# Patient Record
Sex: Female | Born: 2000 | Race: Black or African American | Hispanic: No | Smoking: Never smoker
Health system: Southern US, Community
[De-identification: ages and names within clinical notes are randomized; demographics above are authoritative.]

## PROBLEM LIST (undated history)

## (undated) DIAGNOSIS — J45909 Unspecified asthma, uncomplicated: Secondary | ICD-10-CM

## (undated) DIAGNOSIS — F419 Anxiety disorder, unspecified: Secondary | ICD-10-CM

## (undated) DIAGNOSIS — D649 Anemia, unspecified: Secondary | ICD-10-CM

## (undated) DIAGNOSIS — T7840XA Allergy, unspecified, initial encounter: Secondary | ICD-10-CM

## (undated) DIAGNOSIS — I10 Essential (primary) hypertension: Secondary | ICD-10-CM

## (undated) DIAGNOSIS — E559 Vitamin D deficiency, unspecified: Secondary | ICD-10-CM

## (undated) DIAGNOSIS — F32A Depression, unspecified: Secondary | ICD-10-CM

## (undated) HISTORY — DX: Depression, unspecified: F32.A

## (undated) HISTORY — DX: Allergy, unspecified, initial encounter: T78.40XA

## (undated) HISTORY — PX: HERNIA REPAIR: SHX51

## (undated) HISTORY — DX: Essential (primary) hypertension: I10

## (undated) HISTORY — PX: WISDOM TOOTH EXTRACTION: SHX21

---
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
## 2019-01-03 ENCOUNTER — Ambulatory Visit: Payer: Medicaid Other | Admitting: Obstetrics and Gynecology

## 2019-01-20 ENCOUNTER — Encounter: Payer: Self-pay | Admitting: Emergency Medicine

## 2019-01-20 ENCOUNTER — Ambulatory Visit
Admission: EM | Admit: 2019-01-20 | Discharge: 2019-01-20 | Disposition: A | Discharge status: Home / Self Care | Payer: Medicaid Other | Attending: Internal Medicine | Admitting: Internal Medicine

## 2019-01-20 ENCOUNTER — Other Ambulatory Visit: Payer: Self-pay

## 2019-01-20 DIAGNOSIS — R05 Cough: Secondary | ICD-10-CM | POA: Diagnosis not present

## 2019-01-20 DIAGNOSIS — J45909 Unspecified asthma, uncomplicated: Secondary | ICD-10-CM | POA: Diagnosis not present

## 2019-01-20 DIAGNOSIS — Z881 Allergy status to other antibiotic agents status: Secondary | ICD-10-CM | POA: Insufficient documentation

## 2019-01-20 DIAGNOSIS — Z20828 Contact with and (suspected) exposure to other viral communicable diseases: Secondary | ICD-10-CM | POA: Diagnosis not present

## 2019-01-20 DIAGNOSIS — Z880 Allergy status to penicillin: Secondary | ICD-10-CM | POA: Insufficient documentation

## 2019-01-20 DIAGNOSIS — J069 Acute upper respiratory infection, unspecified: Secondary | ICD-10-CM | POA: Insufficient documentation

## 2019-01-20 DIAGNOSIS — Z79899 Other long term (current) drug therapy: Secondary | ICD-10-CM | POA: Diagnosis not present

## 2019-01-20 LAB — RAPID STREP SCREEN (MED CTR MEBANE ONLY): Streptococcus, Group A Screen (Direct): NEGATIVE

## 2019-01-20 NOTE — ED Triage Notes (Signed)
Patient c/o cough and sore throat that started last night. Denies fever.

## 2019-01-20 NOTE — ED Provider Notes (Signed)
[facility]    CSN: [id number] Arrival date & time: 01/20/19  1309      History   Chief Complaint No chief complaint on file.   HPI Sarah Oneill is a 18 y.o. female with history of asthma comes to urgent care with 1 day history of cough and sore throat.  Patient states that she started experiencing cough and sore throat yesterday and is gotten progressively worse.  She denies fever.  She felt warm this morning but did not take her temperature.  No nausea or vomiting.  No body aches.  Cough is nonproductive.  No chest pain or chest pressure.  She denies fatigue.  Her boyfriend has been sick over the past few days with similar symptoms.  He has since recovered.Marland Kitchen   HPI  Past Medical History:  Diagnosis Date  . Anemia   . Asthma   . Vitamin D deficiency 04/19/2014    Patient Active Problem List   Diagnosis Date Noted  . Encounter for recheck of abscess following incision and drainage 09/06/2016  . Heavy menses 08/12/2016  . Migraine without aura and without status migrainosus, not intractable 07/18/2015  . Tension headache 07/18/2015  . Anxiety state 07/18/2015    Past Surgical History:  Procedure Laterality Date  . HERNIA REPAIR     [facility]  . TONSILLECTOMY AND ADENOIDECTOMY N/A 08/30/2014   Procedure: TONSILLECTOMY AND ADENOIDECTOMY;  Surgeon: Carloyn Manner, MD;  Location: [place];  Service: ENT;  Laterality: N/A;  adenoids cauterized no tissue sent to lab    OB History    Gravida  0   Para  0   Term  0   Preterm  0   AB  0   Living  0     SAB  0   TAB  0   Ectopic  0   Multiple  0   Live Births  0            Home Medications    Prior to Admission medications   Medication Sig Start Date End Date Taking? Authorizing Provider  albuterol (PROAIR HFA) 108 (90 Base) MCG/ACT inhaler Inhale 2 puffs into the lungs every 4 (four) hours as needed.  08/22/14   [provider]  baclofen (LIORESAL) 10 MG tablet Take 1  tablet (10 mg total) by mouth 3 (three) times daily. 07/26/18 07/26/19  Fisher, Linden Dolin, PA-C  EPINEPHrine 0.3 mg/0.3 mL IJ SOAJ injection Inject 0.3 mg into the muscle as needed. For Bee stings    [provider]  sulfamethoxazole-trimethoprim (BACTRIM DS) 800-160 MG tablet Take 1 tablet by mouth 2 (two) times daily. 12/24/18   Cuthriell, Charline Bills, PA-C    Family History Family History  Problem Relation Age of Onset  . Seizures Mother   . Bipolar disorder Mother   . ADD / ADHD Mother   . Anxiety disorder Mother   . Migraines Mother   . Migraines Father   . Bipolar disorder Sister   . ADD / ADHD Sister   . Migraines Sister   . Depression Maternal Grandmother   . Migraines Maternal Grandmother     Social History Social History   Tobacco Use  . Smoking status: Passive Smoke Exposure - Never Smoker  . Smokeless tobacco: Never Used  Substance Use Topics  . Alcohol use: No  . Drug use: No     Allergies   Augmentin [amoxicillin-pot clavulanate], Bee venom, and Shellfish allergy   Review of Systems  Review of Systems  Constitutional: Negative.   HENT: Positive for congestion, postnasal drip, rhinorrhea and sore throat. Negative for ear discharge, ear pain, mouth sores, sinus pressure, sinus pain, sneezing and voice change.   Respiratory: Negative.  Negative for cough, chest tightness and shortness of breath.   Gastrointestinal: Negative.   Genitourinary: Negative.   Musculoskeletal: Negative.   Neurological: Negative.      Physical Exam Triage Vital Signs ED Triage Vitals  Enc Vitals Group     BP      Pulse      Resp      Temp      Temp src      SpO2      Weight      Height      Head Circumference      Peak Flow      Pain Score      Pain Loc      Pain Edu?      Excl. in [place]?    No data found.  Updated Vital Signs LMP 12/23/2018 Comment: neg preg test  Visual Acuity Right Eye Distance:   Left Eye Distance:   Bilateral Distance:    Right Eye  Near:   Left Eye Near:    Bilateral Near:     Physical Exam Vitals signs and nursing note reviewed.  Constitutional:      General: She is not in acute distress.    Appearance: She is well-developed. She is ill-appearing. She is not toxic-appearing.  HENT:     Right Ear: Tympanic membrane normal. No drainage or swelling.     Left Ear: Tympanic membrane normal. No drainage or swelling.     Nose: No congestion or rhinorrhea.     Mouth/Throat:     Mouth: Mucous membranes are moist.     Pharynx: Posterior oropharyngeal erythema present.     Tonsils: 1+ on the right. 1+ on the left.  Cardiovascular:     Rate and Rhythm: Normal rate and regular rhythm.     Heart sounds: Normal heart sounds.  Pulmonary:     Effort: Pulmonary effort is normal.     Breath sounds: Normal breath sounds.  Neurological:     Mental Status: She is alert.      UC Treatments / Results  Labs (all labs ordered are listed, but only abnormal results are displayed) Labs Reviewed - No data to display  EKG   Radiology No results found.  Procedures Procedures (including critical care time)  Medications Ordered in UC Medications - No data to display  Initial Impression / Assessment and Plan / UC Course  I have reviewed the triage vital signs and the nursing notes.  Pertinent labs & imaging results that were available during my care of the patient were reviewed by me and considered in my medical decision making (see chart for details).     1.  Viral URI with cough: Rapid strep test is negative COVID-19 samples have been sent Patient is advised to use salt water gargles (1 tablespoon of salt in 8 ounces of water).  Tylenol as needed for pain or body aches. If patient's symptoms worsen she is advised to return to urgent care to be reevaluated. Final Clinical Impressions(s) / UC Diagnoses   Final diagnoses:  None   Discharge Instructions   None    ED Prescriptions    None     PDMP not reviewed  this encounter.   Merrilee Jansky, MD 01/20/19  1344  

## 2019-01-21 LAB — NOVEL CORONAVIRUS, NAA (HOSP ORDER, SEND-OUT TO REF LAB; TAT 18-24 HRS): SARS-CoV-2, NAA: NOT DETECTED

## 2019-01-23 LAB — CULTURE, GROUP A STREP (THRC)

## 2019-05-22 ENCOUNTER — Emergency Department: Payer: Medicaid Other

## 2019-05-22 ENCOUNTER — Emergency Department
Admission: EM | Admit: 2019-05-22 | Discharge: 2019-05-22 | Disposition: A | Discharge status: Home / Self Care | Payer: Medicaid Other | Attending: Student | Admitting: Student

## 2019-05-22 ENCOUNTER — Other Ambulatory Visit: Payer: Self-pay

## 2019-05-22 DIAGNOSIS — U071 COVID-19: Secondary | ICD-10-CM | POA: Diagnosis not present

## 2019-05-22 DIAGNOSIS — J45909 Unspecified asthma, uncomplicated: Secondary | ICD-10-CM | POA: Diagnosis not present

## 2019-05-22 DIAGNOSIS — Z7722 Contact with and (suspected) exposure to environmental tobacco smoke (acute) (chronic): Secondary | ICD-10-CM | POA: Insufficient documentation

## 2019-05-22 DIAGNOSIS — R05 Cough: Secondary | ICD-10-CM | POA: Diagnosis present

## 2019-05-22 LAB — POC SARS CORONAVIRUS 2 AG: SARS Coronavirus 2 Ag: POSITIVE — AB

## 2019-05-22 NOTE — ED Provider Notes (Signed)
[facility] Emergency Department Provider Note  ____________________________________________   First MD Initiated Contact with Patient 05/22/19 1200     (approximate)  I have reviewed the triage vital signs and the nursing notes.   HISTORY  Chief Complaint Fever and Cough    HPI Sarah Oneill is a 19 y.o. female presents emergency department complaining of cough for 2 days, low-grade temp, body aches and chills and some diarrhea.  She denies chest pain/shortness of breath.  No vomiting.  Patient does work at [employer] and is concerned of Covid exposure.  She does retain her sense of taste and smell.    Past Medical History:  Diagnosis Date  . Anemia   . Asthma   . Vitamin D deficiency 04/19/2014    Patient Active Problem List   Diagnosis Date Noted  . Encounter for recheck of abscess following incision and drainage 09/06/2016  . Heavy menses 08/12/2016  . Migraine without aura and without status migrainosus, not intractable 07/18/2015  . Tension headache 07/18/2015  . Anxiety state 07/18/2015    Past Surgical History:  Procedure Laterality Date  . HERNIA REPAIR     [facility]  . TONSILLECTOMY AND ADENOIDECTOMY N/A 08/30/2014   Procedure: TONSILLECTOMY AND ADENOIDECTOMY;  Surgeon: Bud Face, MD;  Location: [facility];  Service: ENT;  Laterality: N/A;  adenoids cauterized no tissue sent to lab    Prior to Admission medications   Medication Sig Start Date End Date Taking? Authorizing Provider  albuterol (PROAIR HFA) 108 (90 Base) MCG/ACT inhaler Inhale 2 puffs into the lungs every 4 (four) hours as needed.  08/22/14   [provider]  EPINEPHrine 0.3 mg/0.3 mL IJ SOAJ injection Inject 0.3 mg into the muscle as needed. For Bee stings    [provider]    Allergies Augmentin [amoxicillin-pot clavulanate], Bee venom, and Shellfish allergy  Family History  Problem Relation Age of Onset  . Seizures  Mother   . Bipolar disorder Mother   . ADD / ADHD Mother   . Anxiety disorder Mother   . Migraines Mother   . Migraines Father   . Bipolar disorder Sister   . ADD / ADHD Sister   . Migraines Sister   . Depression Maternal Grandmother   . Migraines Maternal Grandmother     Social History Social History   Tobacco Use  . Smoking status: Passive Smoke Exposure - Never Smoker  . Smokeless tobacco: Never Used  Substance Use Topics  . Alcohol use: No  . Drug use: No    Review of Systems  Constitutional: Positive fever/chills Eyes: No visual changes. ENT: No sore throat. Respiratory: Positive cough Cardiovascular: Denies chest pain Gastrointestinal: Denies abdominal pain, positive for loose stools Genitourinary: Negative for dysuria. Musculoskeletal: Negative for back pain. Skin: Negative for rash. Psychiatric: no mood changes,     ____________________________________________   PHYSICAL EXAM:  VITAL SIGNS: ED Triage Vitals  Enc Vitals Group     BP 05/22/19 1148 129/73     Pulse Rate 05/22/19 1148 94     Resp 05/22/19 1148 18     Temp 05/22/19 1148 99 F (37.2 C)     Temp Source 05/22/19 1148 Oral     SpO2 05/22/19 1148 99 %     Weight 05/22/19 1145 175 lb (79.4 kg)     Height 05/22/19 1145 5\' 3"  (1.6 m)     Head Circumference --      Peak Flow --  Pain Score 05/22/19 1144 7     Pain Loc --      Pain Edu? --      Excl. in GC? --     Constitutional: Alert and oriented. Well appearing and in no acute distress. Eyes: Conjunctivae are normal.  Head: Atraumatic. Nose: No congestion/rhinnorhea. Mouth/Throat: Mucous membranes are moist.   Neck:  supple no lymphadenopathy noted Cardiovascular: Normal rate, regular rhythm. Heart sounds are normal Respiratory: Normal respiratory effort.  No retractions, lungs c t a  Abd: soft nontender bs normal all 4 quad  GU: deferred Musculoskeletal: FROM all extremities, warm and well perfused Neurologic:  Normal speech  and language.  Skin:  Skin is warm, dry and intact. No rash noted. Psychiatric: Mood and affect are normal. Speech and behavior are normal.  ____________________________________________   LABS (all labs ordered are listed, but only abnormal results are displayed)  Labs Reviewed  POC SARS CORONAVIRUS 2 AG - Abnormal; Notable for the following components:      Result Value   SARS Coronavirus 2 Ag POSITIVE (*)    All other components within normal limits  POC SARS CORONAVIRUS 2 AG -  ED   ____________________________________________   ____________________________________________  RADIOLOGY  Chest x-ray is normal  ____________________________________________   PROCEDURES  Procedure(s) performed: No  Procedures    ____________________________________________   INITIAL IMPRESSION / ASSESSMENT AND PLAN / ED COURSE  Pertinent labs & imaging results that were available during my care of the patient were reviewed by me and considered in my medical decision making (see chart for details).   Patient is 19 year old female presents emergency department concerns of Covid.  See HPI  Physical exam shows patient to appear well.  Vitals normal.  Remainder exams are unremarkable  POC Covid test is positive   Chest x-ray is normal  Explained findings to the patient.  Due to her being positive for Covid she should remain out of work for 10 days.  Note was sent with her for her employer.  Also encouraged her to contact [facility] employment security's commission as she is very concerned about money.  Told her they may have things available to help her out.  She is to take over-the-counter vitamins.  Return emergency department for worsening.  She is discharged stable condition.  Sarah Oneill was evaluated in Emergency Department on 05/22/2019 for the symptoms described in the history of present illness. She was evaluated in the context of the global COVID-19 pandemic, which  necessitated consideration that the patient might be at risk for infection with the SARS-CoV-2 virus that causes COVID-19. Institutional protocols and algorithms that pertain to the evaluation of patients at risk for COVID-19 are in a state of rapid change based on information released by regulatory bodies including the CDC and federal and state organizations. These policies and algorithms were followed during the patient's care in the ED.   As part of my medical decision making, I reviewed the following data within the electronic MEDICAL RECORD NUMBER Nursing notes reviewed and incorporated, Labs reviewed Covid test positive,, Old chart reviewed, Radiograph reviewed chest x-ray negative, Notes from prior ED visits and [place] Controlled Substance Database  ____________________________________________   FINAL CLINICAL IMPRESSION(S) / ED DIAGNOSES  Final diagnoses:  COVID-19      NEW MEDICATIONS STARTED DURING THIS VISIT:  New Prescriptions   No medications on file     Note:  This document was prepared using Dragon voice recognition software and may include unintentional dictation  errors.    Versie Starks, PA-C 05/22/19 1320    Lilia Pro., MD 05/22/19 [phone]

## 2019-05-22 NOTE — ED Triage Notes (Signed)
Pt reports cough x2 days that has gotten worse over the last 12 hours - Pt c/o generalized body aches with chills/fever x12 hours. Pt reports diarrhea (3 episodes in 24 hours).

## 2019-05-22 NOTE — Discharge Instructions (Addendum)
Take otc mucinex, zinc, magnesium, vit c Take otc tylenol for fever as needed Return to the ER if worsening Look at [facility] to see if you qualify for unemployment benefit due to covid illness

## 2019-07-25 ENCOUNTER — Other Ambulatory Visit: Payer: Self-pay | Admitting: Obstetrics and Gynecology

## 2019-08-02 ENCOUNTER — Other Ambulatory Visit (HOSPITAL_COMMUNITY)
Admission: RE | Admit: 2019-08-02 | Discharge: 2019-08-02 | Disposition: A | Discharge status: Home / Self Care | Payer: Medicaid Other | Source: Ambulatory Visit | Attending: Advanced Practice Midwife | Admitting: Advanced Practice Midwife

## 2019-08-02 ENCOUNTER — Encounter: Payer: Self-pay | Admitting: Advanced Practice Midwife

## 2019-08-02 ENCOUNTER — Ambulatory Visit (INDEPENDENT_AMBULATORY_CARE_PROVIDER_SITE_OTHER): Payer: Medicaid Other | Admitting: Advanced Practice Midwife

## 2019-08-02 ENCOUNTER — Other Ambulatory Visit: Payer: Self-pay

## 2019-08-02 VITALS — BP 138/82 | Wt 172.0 lb

## 2019-08-02 DIAGNOSIS — Z113 Encounter for screening for infections with a predominantly sexual mode of transmission: Secondary | ICD-10-CM

## 2019-08-02 DIAGNOSIS — Z3401 Encounter for supervision of normal first pregnancy, first trimester: Secondary | ICD-10-CM | POA: Diagnosis present

## 2019-08-02 DIAGNOSIS — Z3A08 8 weeks gestation of pregnancy: Secondary | ICD-10-CM

## 2019-08-02 DIAGNOSIS — Z6830 Body mass index (BMI) 30.0-30.9, adult: Secondary | ICD-10-CM

## 2019-08-02 DIAGNOSIS — Z91013 Allergy to seafood: Secondary | ICD-10-CM | POA: Insufficient documentation

## 2019-08-02 DIAGNOSIS — F909 Attention-deficit hyperactivity disorder, unspecified type: Secondary | ICD-10-CM | POA: Insufficient documentation

## 2019-08-02 DIAGNOSIS — O99211 Obesity complicating pregnancy, first trimester: Secondary | ICD-10-CM

## 2019-08-02 DIAGNOSIS — Z91030 Bee allergy status: Secondary | ICD-10-CM | POA: Insufficient documentation

## 2019-08-02 LAB — POCT URINALYSIS DIPSTICK OB
Glucose, UA: NEGATIVE
POC,PROTEIN,UA: NEGATIVE

## 2019-08-02 NOTE — Patient Instructions (Signed)
Exercise During Pregnancy Exercise is an important part of being healthy for people of all ages. Exercise improves the function of your heart and lungs and helps you maintain strength, flexibility, and a healthy body weight. Exercise also boosts energy levels and elevates mood. Most women should exercise regularly during pregnancy. In rare cases, women with certain medical conditions or complications may be asked to limit or avoid exercise during pregnancy. How does this affect me? Along with maintaining general strength and flexibility, exercising during pregnancy can help:  Keep strength in muscles that are used during labor and childbirth.  Decrease low back pain.  Reduce symptoms of depression. Control weight gain during pregnancy. Breastfeeding  Choosing to breastfeed is one of the best decisions you can make for yourself and your baby. A change in hormones during pregnancy causes your breasts to make breast milk in your milk-producing glands. Hormones prevent breast milk from being released before your baby is born. They also prompt milk flow after birth. Once breastfeeding has begun, thoughts of your baby, as well as his or her sucking or crying, can stimulate the release of milk from your milk-producing glands. Benefits of breastfeeding Research shows that breastfeeding offers many health benefits for infants and mothers. It also offers a cost-free and convenient way to feed your baby. For your baby  Your first milk (colostrum) helps your baby's digestive system to function better.  Special cells in your milk (antibodies) help your baby to fight off infections.  Breastfed babies are less likely to develop asthma, allergies, obesity, or type 2 diabetes. They are also at lower risk for sudden infant death syndrome (SIDS).  Nutrients in breast milk are better able to meet your baby's needs compared to infant formula.  Breast milk improves your baby's brain development. For  you  Breastfeeding helps to create a very special bond between you and your baby.  Breastfeeding is convenient. Breast milk costs nothing and is always available at the correct temperature.  Breastfeeding helps to burn calories. It helps you to lose the weight that you gained during pregnancy.  Breastfeeding makes your uterus return faster to its size before pregnancy. It also slows bleeding (lochia) after you give birth.  Breastfeeding helps to lower your risk of developing type 2 diabetes, osteoporosis, rheumatoid arthritis, cardiovascular disease, and breast, ovarian, uterine, and endometrial cancer later in life. Breastfeeding basics Starting breastfeeding  Find a comfortable place to sit or lie down, with your neck and back well-supported.  Place a pillow or a rolled-up blanket under your baby to bring him or her to the level of your breast (if you are seated). Nursing pillows are specially designed to help support your arms and your baby while you breastfeed.  Make sure that your baby's tummy (abdomen) is facing your abdomen.  Gently massage your breast. With your fingertips, massage from the outer edges of your breast inward toward the nipple. This encourages milk flow. If your milk flows slowly, you may need to continue this action during the feeding.  Support your breast with 4 fingers underneath and your thumb above your nipple (make the letter "C" with your hand). Make sure your fingers are well away from your nipple and your baby's mouth.  Stroke your baby's lips gently with your finger or nipple.  When your baby's mouth is open wide enough, quickly bring your baby to your breast, placing your entire nipple and as much of the areola as possible into your baby's mouth. The areola is the  colored area around your nipple. ? More areola should be visible above your baby's upper lip than below the lower lip. ? Your baby's lips should be opened and extended outward (flanged) to  ensure an adequate, comfortable latch. ? Your baby's tongue should be between his or her lower gum and your breast.  Make sure that your baby's mouth is correctly positioned around your nipple (latched). Your baby's lips should create a seal on your breast and be turned out (everted).  It is common for your baby to suck about 2-3 minutes in order to start the flow of breast milk. Latching Teaching your baby how to latch onto your breast properly is very important. An improper latch can cause nipple pain, decreased milk supply, and poor weight gain in your baby. Also, if your baby is not latched onto your nipple properly, he or she may swallow some air during feeding. This can make your baby fussy. Burping your baby when you switch breasts during the feeding can help to get rid of the air. However, teaching your baby to latch on properly is still the best way to prevent fussiness from swallowing air while breastfeeding. Signs that your baby has successfully latched onto your nipple  Silent tugging or silent sucking, without causing you pain. Infant's lips should be extended outward (flanged).  Swallowing heard between every 3-4 sucks once your milk has started to flow (after your let-down milk reflex occurs).  Muscle movement above and in front of his or her ears while sucking. Signs that your baby has not successfully latched onto your nipple  Sucking sounds or smacking sounds from your baby while breastfeeding.  Nipple pain. If you think your baby has not latched on correctly, slip your finger into the corner of your baby's mouth to break the suction and place it between your baby's gums. Attempt to start breastfeeding again. Signs of successful breastfeeding Signs from your baby  Your baby will gradually decrease the number of sucks or will completely stop sucking.  Your baby will fall asleep.  Your baby's body will relax.  Your baby will retain a small amount of milk in his or her  mouth.  Your baby will let go of your breast by himself or herself. Signs from you  Breasts that have increased in firmness, weight, and size 1-3 hours after feeding.  Breasts that are softer immediately after breastfeeding.  Increased milk volume, as well as a change in milk consistency and color by the fifth day of breastfeeding.  Nipples that are not sore, cracked, or bleeding. Signs that your baby is getting enough milk  Wetting at least 1-2 diapers during the first 24 hours after birth.  Wetting at least 5-6 diapers every 24 hours for the first week after birth. The urine should be clear or pale yellow by the age of 5 days.  Wetting 6-8 diapers every 24 hours as your baby continues to grow and develop.  At least 3 stools in a 24-hour period by the age of 5 days. The stool should be soft and yellow.  At least 3 stools in a 24-hour period by the age of 7 days. The stool should be seedy and yellow.  No loss of weight greater than 10% of birth weight during the first 3 days of life.  Average weight gain of 4-7 oz (113-198 g) per week after the age of 4 days.  Consistent daily weight gain by the age of 5 days, without weight loss after the  age of 2 weeks. After a feeding, your baby may spit up a small amount of milk. This is normal. Breastfeeding frequency and duration Frequent feeding will help you make more milk and can prevent sore nipples and extremely full breasts (breast engorgement). Breastfeed when you feel the need to reduce the fullness of your breasts or when your baby shows signs of hunger. This is called "breastfeeding on demand." Signs that your baby is hungry include:  Increased alertness, activity, or restlessness.  Movement of the head from side to side.  Opening of the mouth when the corner of the mouth or cheek is stroked (rooting).  Increased sucking sounds, smacking lips, cooing, sighing, or squeaking.  Hand-to-mouth movements and sucking on fingers or  hands.  Fussing or crying. Avoid introducing a pacifier to your baby in the first 4-6 weeks after your baby is born. After this time, you may choose to use a pacifier. Research has shown that pacifier use during the first year of a baby's life decreases the risk of sudden infant death syndrome (SIDS). Allow your baby to feed on each breast as long as he or she wants. When your baby unlatches or falls asleep while feeding from the first breast, offer the second breast. Because newborns are often sleepy in the first few weeks of life, you may need to awaken your baby to get him or her to feed. Breastfeeding times will vary from baby to baby. However, the following rules can serve as a guide to help you make sure that your baby is properly fed:  Newborns (babies 20 weeks of age or younger) may breastfeed every 1-3 hours.  Newborns should not go without breastfeeding for longer than 3 hours during the day or 5 hours during the night.  You should breastfeed your baby a minimum of 8 times in a 24-hour period. Breast milk pumping     Pumping and storing breast milk allows you to make sure that your baby is exclusively fed your breast milk, even at times when you are unable to breastfeed. This is especially important if you go back to work while you are still breastfeeding, or if you are not able to be present during feedings. Your lactation consultant can help you find a method of pumping that works best for you and give you guidelines about how long it is safe to store breast milk. Caring for your breasts while you breastfeed Nipples can become dry, cracked, and sore while breastfeeding. The following recommendations can help keep your breasts moisturized and healthy:  Avoid using soap on your nipples.  Wear a supportive bra designed especially for nursing. Avoid wearing underwire-style bras or extremely tight bras (sports bras).  Air-dry your nipples for 3-4 minutes after each feeding.  Use only  cotton bra pads to absorb leaked breast milk. Leaking of breast milk between feedings is normal.  Use lanolin on your nipples after breastfeeding. Lanolin helps to maintain your skin's normal moisture barrier. Pure lanolin is not harmful (not toxic) to your baby. You may also hand express a few drops of breast milk and gently massage that milk into your nipples and allow the milk to air-dry. In the first few weeks after giving birth, some women experience breast engorgement. Engorgement can make your breasts feel heavy, warm, and tender to the touch. Engorgement peaks within 3-5 days after you give birth. The following recommendations can help to ease engorgement:  Completely empty your breasts while breastfeeding or pumping. You may want to  start by applying warm, moist heat (in the shower or with warm, water-soaked hand towels) just before feeding or pumping. This increases circulation and helps the milk flow. If your baby does not completely empty your breasts while breastfeeding, pump any extra milk after he or she is finished.  Apply ice packs to your breasts immediately after breastfeeding or pumping, unless this is too uncomfortable for you. To do this: ? Put ice in a plastic bag. ? Place a towel between your skin and the bag. ? Leave the ice on for 20 minutes, 2-3 times a day.  Make sure that your baby is latched on and positioned properly while breastfeeding. If engorgement persists after 48 hours of following these recommendations, contact your health care provider or a [employer]. Overall health care recommendations while breastfeeding  Eat 3 healthy meals and 3 snacks every day. Well-nourished mothers who are breastfeeding need an additional 450-500 calories a day. You can meet this requirement by increasing the amount of a balanced diet that you eat.  Drink enough water to keep your urine pale yellow or clear.  Rest often, relax, and continue to take your prenatal vitamins  to prevent fatigue, stress, and low vitamin and mineral levels in your body (nutrient deficiencies).  Do not use any products that contain nicotine or tobacco, such as cigarettes and e-cigarettes. Your baby may be harmed by chemicals from cigarettes that pass into breast milk and exposure to secondhand smoke. If you need help quitting, ask your health care provider.  Avoid alcohol.  Do not use illegal drugs or marijuana.  Talk with your health care provider before taking any medicines. These include over-the-counter and prescription medicines as well as vitamins and herbal supplements. Some medicines that may be harmful to your baby can pass through breast milk.  It is possible to become pregnant while breastfeeding. If birth control is desired, ask your health care provider about options that will be safe while breastfeeding your baby. Where to find more information: [employer]: www.llli.org Contact a health care provider if:  You feel like you want to stop breastfeeding or have become frustrated with breastfeeding.  Your nipples are cracked or bleeding.  Your breasts are red, tender, or warm.  You have: ? Painful breasts or nipples. ? A swollen area on either breast. ? A fever or chills. ? Nausea or vomiting. ? Drainage other than breast milk from your nipples.  Your breasts do not become full before feedings by the fifth day after you give birth.  You feel sad and depressed.  Your baby is: ? Too sleepy to eat well. ? Having trouble sleeping. ? More than 45 week old and wetting fewer than 6 diapers in a 24-hour period. ? Not gaining weight by 57 days of age.  Your baby has fewer than 3 stools in a 24-hour period.  Your baby's skin or the white parts of his or her eyes become yellow. Get help right away if:  Your baby is overly tired (lethargic) and does not want to wake up and feed.  Your baby develops an unexplained fever. Summary  Breastfeeding  offers many health benefits for infant and mothers.  Try to breastfeed your infant when he or she shows early signs of hunger.  Gently tickle or stroke your baby's lips with your finger or nipple to allow the baby to open his or her mouth. Bring the baby to your breast. Make sure that much of the areola is in  your baby's mouth. Offer one side and burp the baby before you offer the other side.  Talk with your health care provider or lactation consultant if you have questions or you face problems as you breastfeed. This information is not intended to replace advice given to you by your health care provider. Make sure you discuss any questions you have with your health care provider. Document Revised: 07/02/2017 Document Reviewed: 05/09/2016 Elsevier Patient Education  2020 [employer]    Reduce the risk of needing insulin if you develop diabetes during pregnancy.  Decrease the risk of cesarean delivery.  Speed up your recovery after giving birth. How does this affect my baby? Exercise can help you have a healthy pregnancy. Exercise does not cause premature birth. It will not cause your baby to weigh less at birth. What exercises can I do? Many exercises are safe for you to do during pregnancy. Do a variety of exercises that safely increase your heart and breathing rates and help you build and maintain muscle strength. Do exercises exactly as told by your health care provider. You may do these exercises:  Walking or hiking.  Swimming.  Water aerobics.  Riding a stationary bike.  Strength training.  Modified yoga or Pilates. Tell your instructor that you are pregnant. Avoid overstretching, and avoid lying on your back for long periods of time.  Running or jogging. Only choose this type of exercise if you: ? Ran or jogged regularly before your pregnancy. ? Can run or jog and still talk in complete sentences. What exercises should I avoid? Depending on your level of fitness and  whether you exercised regularly before your pregnancy, you may be told to limit high-intensity exercise. You can tell that you are exercising at a high intensity if you are breathing much harder and faster and cannot hold a conversation while exercising. You must avoid:  Contact sports.  Activities that put you at risk for falling on or being hit in the belly, such as downhill skiing, water skiing, surfing, rock climbing, cycling, gymnastics, and horseback riding.  Scuba diving.  Skydiving.  Yoga or Pilates in a room that is heated to high temperatures.  Jogging or running, unless you ran or jogged regularly before your pregnancy. While jogging or running, you should always be able to talk in full sentences. Do not run or jog so fast that you are unable to have a conversation.  Do not exercise at more than 6,000 feet above sea level (high elevation) if you are not used to exercising at high elevation. How do I exercise in a safe way?   Avoid overheating. Do not exercise in very high temperatures.  Wear loose-fitting, breathable clothes.  Avoid dehydration. Drink enough water before, during, and after exercise to keep your urine pale yellow.  Avoid overstretching. Because of hormone changes during pregnancy, it is easy to overstretch muscles, tendons, and ligaments during pregnancy.  Start slowly and ask your health care provider to recommend the types of exercise that are safe for you.  Do not exercise to lose weight. Follow these instructions at home:  Exercise on most days or all days of the week. Try to exercise for 30 minutes a day, 5 days a week, unless your health care provider tells you not to.  If you actively exercised before your pregnancy and you are healthy, your health care provider may tell you to continue to do moderate to high-intensity exercise.  If you are just starting to exercise or did  not exercise much before your pregnancy, your health care provider may tell  you to do low to moderate-intensity exercise. Questions to ask your health care provider  Is exercise safe for me?  What are signs that I should stop exercising?  Does my health condition mean that I should not exercise during pregnancy?  When should I avoid exercising during pregnancy? Stop exercising and contact a health care provider if: You have any unusual symptoms, such as:  Mild contractions of the uterus or cramps in the abdomen.  Dizziness that does not go away when you rest. Stop exercising and get help right away if: You have any unusual symptoms, such as:  Sudden, severe pain in your low back or your belly.  Mild contractions of the uterus or cramps in the abdomen that do not improve with rest and drinking fluids.  Chest pain.  Bleeding or fluid leaking from your vagina.  Shortness of breath. These symptoms may represent a serious problem that is an emergency. Do not wait to see if the symptoms will go away. Get medical help right away. Call your local emergency services (911 in the [place] Do not drive yourself to the hospital. Summary  Most women should exercise regularly throughout pregnancy. In rare cases, women with certain medical conditions or complications may be asked to limit or avoid exercise during pregnancy.  Do not exercise to lose weight during pregnancy.  Your health care provider will tell you what level of physical activity is right for you.  Stop exercising and contact a health care provider if you have mild contractions of the uterus or cramps in the abdomen. Get help right away if these contractions or cramps do not improve with rest and drinking fluids.  Stop exercising and get help right away if you have sudden, severe pain in your low back or belly, chest pain, shortness of breath, or bleeding or leaking of fluid from your vagina. This information is not intended to replace advice given to you by your health care provider. Make sure you  discuss any questions you have with your health care provider. Document Revised: 07/29/2018 Document Reviewed: 05/12/2018 Elsevier Patient Education  2020 [employer]. Eating Plan for Pregnant Women While you are pregnant, your body requires additional nutrition to help support your growing baby. You also have a higher need for some vitamins and minerals, such as folic acid, calcium, iron, and vitamin D. Eating a healthy, well-balanced diet is very important for your health and your baby's health. Your need for extra calories varies for the three 7-month segments of your pregnancy (trimesters). For most women, it is recommended to consume:  150 extra calories a day during the first trimester.  300 extra calories a day during the second trimester.  300 extra calories a day during the third trimester. What are tips for following this plan?   Do not try to lose weight or go on a diet during pregnancy.  Limit your overall intake of foods that have "empty calories." These are foods that have little nutritional value, such as sweets, desserts, candies, and sugar-sweetened beverages.  Eat a variety of foods (especially fruits and vegetables) to get a full range of vitamins and minerals.  Take a prenatal vitamin to help meet your additional vitamin and mineral needs during pregnancy, specifically for folic acid, iron, calcium, and vitamin D.  Remember to stay active. Ask your health care provider what types of exercise and activities are safe for you.  Practice good food  safety and cleanliness. Wash your hands before you eat and after you prepare raw meat. Wash all fruits and vegetables well before peeling or eating. Taking these actions can help to prevent food-borne illnesses that can be very dangerous to your baby, such as listeriosis. Ask your health care provider for more information about listeriosis. What does 150 extra calories look like? Healthy options that provide 150 extra calories  each day could be any of the following:  6-8 oz (170-230 g) of plain low-fat yogurt with  cup of berries.  1 apple with 2 teaspoons (11 g) of peanut butter.  Cut-up vegetables with  cup (60 g) of hummus.  8 oz (230 mL) or 1 cup of low-fat chocolate milk.  1 stick of string cheese with 1 medium orange.  1 peanut butter and jelly sandwich that is made with one slice of whole-wheat bread and 1 tsp (5 g) of peanut butter. For 300 extra calories, you could eat two of those healthy options each day. What is a healthy amount of weight to gain? The right amount of weight gain for you is based on your BMI before you became pregnant. If your BMI:  Was less than 18 (underweight), you should gain 28-40 lb (13-18 kg).  Was 18-24.9 (normal), you should gain 25-35 lb (11-16 kg).  Was 25-29.9 (overweight), you should gain 15-25 lb (7-11 kg).  Was 30 or greater (obese), you should gain 11-20 lb (5-9 kg). What if I am having twins or multiples? Generally, if you are carrying twins or multiples:  You may need to eat 300-600 extra calories a day.  The recommended range for total weight gain is 25-54 lb (11-25 kg), depending on your BMI before pregnancy.  Talk with your health care provider to find out about nutritional needs, weight gain, and exercise that is right for you. What foods can I eat?  Fruits All fruits. Eat a variety of colors and types of fruit. Remember to wash your fruits well before peeling or eating. Vegetables All vegetables. Eat a variety of colors and types of vegetables. Remember to wash your vegetables well before peeling or eating. Grains All grains. Choose whole grains, such as whole-wheat bread, oatmeal, or brown rice. Meats and other protein foods Lean meats, including chicken, [place], fish, and lean cuts of beef, veal, or pork. If you eat fish or seafood, choose options that are higher in omega-3 fatty acids and lower in mercury, such as salmon, herring, mussels,  trout, sardines, pollock, shrimp, crab, and lobster. Tofu. Tempeh. Beans. Eggs. Peanut butter and other nut butters. Make sure that all meats, poultry, and eggs are cooked to food-safe temperatures or "well-done." Two or more servings of fish are recommended each week in order to get the most benefits from omega-3 fatty acids that are found in seafood. Choose fish that are lower in mercury. You can find more information online:  [employer]ee Dairy Pasteurized milk and milk alternatives (such as almond milk). Pasteurized yogurt and pasteurized cheese. Cottage cheese. Sour cream. Beverages Water. Juices that contain 100% fruit juice or vegetable juice. Caffeine-free teas and decaffeinated coffee. Drinks that contain caffeine are okay to drink, but it is better to avoid caffeine. Keep your total caffeine intake to less than 200 mg each day (which is 12 oz or 355 mL of coffee, tea, or soda) or the limit as told by your health care provider. Fats and oils Fats and oils are okay to include in moderation. Sweets and desserts Sweets and  desserts are okay to include in moderation. Seasoning and other foods All pasteurized condiments. The items listed above may not be a complete list of foods and beverages you can eat. Contact a dietitian for more information. What foods are not recommended? Fruits Unpasteurized fruit juices. Vegetables Raw (unpasteurized) vegetable juices. Meats and other protein foods Lunch meats, bologna, hot dogs, or other deli meats. (If you must eat those meats, reheat them until they are steaming hot.) Refrigerated pat, meat spreads from a meat counter, smoked seafood that is found in the refrigerated section of a store. Raw or undercooked meats, poultry, and eggs. Raw fish, such as sushi or sashimi. Fish that have high mercury content, such as tilefish, shark, swordfish, and king mackerel. To learn more about mercury in fish, talk with your health care provider or look for  online resources, such as:  GuamGaming.ch Dairy Raw (unpasteurized) milk and any foods that have raw milk in them. Soft cheeses, such as feta, queso blanco, queso fresco, Brie, Camembert cheeses, blue-veined cheeses, and Panela cheese (unless it is made with pasteurized milk, which must be stated on the label). Beverages Alcohol. Sugar-sweetened beverages, such as sodas, teas, or energy drinks. Seasoning and other foods Homemade fermented foods and drinks, such as pickles, sauerkraut, or kombucha drinks. (Store-bought pasteurized versions of these are okay.) Salads that are made in a store or deli, such as ham salad, chicken salad, egg salad, tuna salad, and seafood salad. The items listed above may not be a complete list of foods and beverages you should avoid. Contact a dietitian for more information. Where to find more information To calculate the number of calories you need based on your height, weight, and activity level, you can use an online calculator such as:  MobileTransition.ch To calculate how much weight you should gain during pregnancy, you can use an online pregnancy weight gain calculator such as:  StreamingFood.com.cy Summary  While you are pregnant, your body requires additional nutrition to help support your growing baby.  Eat a variety of foods, especially fruits and vegetables to get a full range of vitamins and minerals.  Practice good food safety and cleanliness. Wash your hands before you eat and after you prepare raw meat. Wash all fruits and vegetables well before peeling or eating. Taking these actions can help to prevent food-borne illnesses, such as listeriosis, that can be very dangerous to your baby.  Do not eat raw meat or fish. Do not eat fish that have high mercury content, such as tilefish, shark, swordfish, and king mackerel. Do not eat unpasteurized (raw) dairy.  Take a prenatal vitamin to help meet your  additional vitamin and mineral needs during pregnancy, specifically for folic acid, iron, calcium, and vitamin D. This information is not intended to replace advice given to you by your health care provider. Make sure you discuss any questions you have with your health care provider. Document Revised: 08/26/2018 Document Reviewed: 01/02/2017 Elsevier Patient Education  2020 [employer]. Prenatal Care Prenatal care is health care during pregnancy. It helps you and your unborn baby (fetus) stay as healthy as possible. Prenatal care may be provided by a midwife, a family practice health care provider, or a childbirth and pregnancy specialist (obstetrician). How does this affect me? During pregnancy, you will be closely monitored for any new conditions that might develop. To lower your risk of pregnancy complications, you and your health care provider will talk about any underlying conditions you have. How does this affect my baby? Early  and consistent prenatal care increases the chance that your baby will be healthy during pregnancy. Prenatal care lowers the risk that your baby will be:  Born early (prematurely).  Smaller than expected at birth (small for gestational age). What can I expect at the first prenatal care visit? Your first prenatal care visit will likely be the longest. You should schedule your first prenatal care visit as soon as you know that you are pregnant. Your first visit is a good time to talk about any questions or concerns you have about pregnancy. At your visit, you and your health care provider will talk about:  Your medical history, including: ? Any past pregnancies. ? Your family's medical history. ? The baby's father's medical history. ? Any long-term (chronic) health conditions you have and how you manage them. ? Any surgeries or procedures you have had. ? Any current over-the-counter or prescription medicines, herbs, or supplements you are taking.  Other factors  that could pose a risk to your baby, including:  Your home setting and your stress levels, including: ? Exposure to abuse or violence. ? Household financial strain. ? Mental health conditions you have.  Your daily health habits, including diet and exercise. Your health care provider will also:  Measure your weight, height, and blood pressure.  Do a physical exam, including a pelvic and breast exam.  Perform blood tests and urine tests to check for: ? Urinary tract infection. ? Sexually transmitted infections (STIs). ? Low iron levels in your blood (anemia). ? Blood type and certain proteins on red blood cells (Rh antibodies). ? Infections and immunity to viruses, such as hepatitis B and rubella. ? HIV (human immunodeficiency virus).  Do an ultrasound to confirm your baby's growth and development and to help predict your estimated due date (EDD). This ultrasound is done with a probe that is inserted into the vagina (transvaginal ultrasound).  Discuss your options for genetic screening.  Give you information about how to keep yourself and your baby healthy, including: ? Nutrition and taking vitamins. ? Physical activity. ? How to manage pregnancy symptoms such as nausea and vomiting (morning sickness). ? Infections and substances that may be harmful to your baby and how to avoid them. ? Food safety. ? Dental care. ? Working. ? Travel. ? Warning signs to watch for and when to call your health care provider. How often will I have prenatal care visits? After your first prenatal care visit, you will have regular visits throughout your pregnancy. The visit schedule is often as follows:  Up to week 28 of pregnancy: once every 4 weeks.  28-36 weeks: once every 2 weeks.  After 36 weeks: every week until delivery. Some women may have visits more or less often depending on any underlying health conditions and the health of the baby. Keep all follow-up and prenatal care visits as told  by your health care provider. This is important. What happens during routine prenatal care visits? Your health care provider will:  Measure your weight and blood pressure.  Check for fetal heart sounds.  Measure the height of your uterus in your abdomen (fundal height). This may be measured starting around week 20 of pregnancy.  Check the position of your baby inside your uterus.  Ask questions about your diet, sleeping patterns, and whether you can feel the baby move.  Review warning signs to watch for and signs of labor.  Ask about any pregnancy symptoms you are having and how you are dealing with them. Symptoms  may include: ? Headaches. ? Nausea and vomiting. ? Vaginal discharge. ? Swelling. ? Fatigue. ? Constipation. ? Any discomfort, including back or pelvic pain. Make a list of questions to ask your health care provider at your routine visits. What tests might I have during prenatal care visits? You may have blood, urine, and imaging tests throughout your pregnancy, such as:  Urine tests to check for glucose, protein, or signs of infection.  Glucose tests to check for a form of diabetes that can develop during pregnancy (gestational diabetes mellitus). This is usually done around week 24 of pregnancy.  An ultrasound to check your baby's growth and development and to check for birth defects. This is usually done around week 20 of pregnancy.  A test to check for group B strep (GBS) infection. This is usually done around week 36 of pregnancy.  Genetic testing. This may include blood or imaging tests, such as an ultrasound. Some genetic tests are done during the first trimester and some are done during the second trimester. What else can I expect during prenatal care visits? Your health care provider may recommend getting certain vaccines during pregnancy. These may include:  A yearly flu shot (annual influenza vaccine). This is especially important if you will be pregnant  during flu season.  Tdap (tetanus, diphtheria, pertussis) vaccine. Getting this vaccine during pregnancy can protect your baby from whooping cough (pertussis) after birth. This vaccine may be recommended between weeks 27 and 36 of pregnancy. Later in your pregnancy, your health care provider may give you information about:  Childbirth and breastfeeding classes.  Choosing a health care provider for your baby.  Umbilical cord banking.  Breastfeeding.  Birth control after your baby is born.  The hospital labor and delivery unit and how to tour it.  Registering at the hospital before you go into labor. Where to find more information  Office on Women's Health: TravelLesson.cawomenshealth.gov  American Pregnancy Association: americanpregnancy.org  March of Dimes: marchofdimes.org Summary  Prenatal care helps you and your baby stay as healthy as possible during pregnancy.  Your first prenatal care visit will most likely be the longest.  You will have visits and tests throughout your pregnancy to monitor your health and your baby's health.  Bring a list of questions to your visits to ask your health care provider.  Make sure to keep all follow-up and prenatal care visits with your health care provider. This information is not intended to replace advice given to you by your health care provider. Make sure you discuss any questions you have with your health care provider. Document Revised: 07/28/2018 Document Reviewed: 04/06/2017 Elsevier Patient Education  2020 [employer]     COVID-19 and Your Pregnancy FAQ  How can I prevent infection with COVID-19 during my pregnancy? Social distancing is key. Please limit any interactions in public. Try and work from home if possible. Frequently wash your hands after touching possibly contaminated surfaces. Avoid touching your face.  Minimize trips to the store. Consider online ordering when possible.   Should I wear a mask? YES. It is recommended  by the CDC that all people wear a cloth mask or facial covering in public. You should wear a mask to your visits in the office. This will help reduce transmission as well as your risk or acquiring COVID-19. New studies are showing that even asymptomatic individuals can spread the virus from talking.   Where can I get a mask? [place] and the city of [place] are partnering  to provide masks to community members. You can pick up a mask from several locations. This website also has instructions about how to make a mask by sewing or without sewing by using a t-shirt or bandana.  https://www.-[place].gov/i-want-to/learn-about/covid-19-information-and-updates/covid-19-face-mask-project  Studies have shown that if you were a tube or nylon stocking from pantyhose over a cloth mask it makes the cloth mask almost as effective as a N95 mask.  AntiquesInvestors.de  What are the symptoms of COVID-19? Fever (greater than 100.4 F), dry cough, shortness of breath.  Am I more at risk for COVID-19 since I am pregnant? There is not currently data showing that pregnant women are more adversely impacted by COVID-19 than the general population. However, we know that pregnant women tend to have worse respiratory complications from similar diseases such as the flu and SARS and for this reason should be considered an at-risk population.  What do I do if I am experiencing the symptoms of COVID-19? Testing is being limited because of test availability. If you are experiencing symptoms you should quarantine yourself, and the members of your family, for at least 2 weeks at home.   Please visit this website for more information: DiscoHelp.si.html  When should I go to the Emergency Room? Please go to the emergency room if you are  experiencing ANY of these symptoms*:  1.    Difficulty breathing or shortness of breath 2.    Persistent pain or pressure in the chest 3.    Confusion or difficulty being aroused (or awakened) 4.    Bluish lips or face  *This list is not all inclusive. Please consult our office for any other symptoms that are severe or concerning.  What do I do if I am having difficulty breathing? You should go to the Emergency Room for evaluation. At this time they have a tent set up for evaluating patients with COVID-19 symptoms.   How will my prenatal care be different because of the COVID-19 pandemic? It has been recommended to reduce the frequency of face-to-face visits and use resources such as telephone and virtual visits when possible. Using a scale, blood pressure machine and fetal doppler at home can further help reduce face-to-face visits. You will be provided with additional information on this topic.  We ask that you come to your visits alone to minimize potential exposures to  COVID-19.  How can I receive childbirth education? At this time in-person classes have been cancelled. You can register for online childbirth education, breastfeeding, and newborn care classes.  Please visit:  [email or web address] for more information  How will my hospital birth experience be different? The hospital is currently limiting visitors. This means that while you are in labor you can only have one person at the hospital with you. Additional family members will not be allowed to wait in the building or outside your room. Your one support person can be the father of the baby, a relative, a doula, or a friend. Once one support person is designated that person will wear a band. This band cannot be shared with multiple people.  Nitrous Gas is not being offered for pain relief since the tubing and filter for the machine can not be sanitized in a way to guarantee prevention of transmission of COVID-19.  Nasal  cannula use of oxygen for fetal indications has also been discontinued.  Currently a clear plastic sheet is being hung between mom and the delivering provider during pushing and delivery to help prevent transmission of COVID-19.  How long will I stay in the hospital for after giving birth? It is also recommended that discharge home be expedited during the COVID-19 outbreak. This means staying for 1 day after a vaginal delivery and 2 days after a cesarean section. Patients who need to stay longer for medical reasons are allowed to do so, but the goal will be for expedited discharge home.   What if I have COVID-19 and I am in labor? We ask that you wear a mask while on labor and delivery. We will try and accommodate you being placed in a room that is capable of filtering the air. Please call ahead if you are in labor and on your way to the hospital. The phone number for labor and delivery at [facility] is [phone].  If I have COVID-19 when my baby is born how can I prevent my baby from contracting COVID-19? This is an issue that will have to be discussed on a case-by-case basis. Current recommendations suggest providing separate isolation rooms for both the mother and new infant as well as limiting visitors. However, there are practical challenges to this recommendation. The situation will assuredly change and decisions will be influenced by the desires of the mother and availability of space.  Some suggestions are the use of a curtain or physical barrier between mom and infant, hand hygiene, mom wearing a mask, or 6 feet of spacing between a mom and infant.   Can I breastfeed during the COVID-19 pandemic?   Yes, breastfeeding is encouraged.  Can I breastfeed if I have COVID-19? Yes. Covid-19 has not been found in breast milk. This means you cannot give COVID-19 to your child through breast milk. Breast feeding will also help pass antibodies to fight infection to your  baby.   What precautions should I take when breastfeeding if I have COVID-19? If a mother and newborn do room-in and the mother wishes to feed at the breast, she should put on a facemask and practice hand hygiene before each feeding.  What precautions should I take when pumping if I have COVID-19? Prior to expressing breast milk, mothers should practice hand hygiene. After each pumping session, all parts that come into contact with breast milk should be thoroughly washed and the entire pump should be appropriately disinfected per the manufacturer's instructions. This expressed breast milk should be fed to the newborn by a healthy caregiver.  What if I am pregnant and work in healthcare? Based on limited data regarding COVID-19 and pregnancy, ACOG currently does not propose creating additional restrictions on pregnant health care personnel because of COVID-19 alone. Pregnant women do not appear to be at higher risk of severe disease related to COVID-19. Pregnant health care personnel should follow CDC risk assessment and infection control guidelines for health care personnel exposed to patients with suspected or confirmed COVID-19. Adherence to recommended infection prevention and control practices is an important part of protecting all health care personnel in health care settings.    Information on COVID-19 in pregnancy is very limited; however, facilities may want to consider limiting exposure of pregnant health care personnel to patients with confirmed or suspected COVID-19 infection, especially during higher-risk procedures (eg, aerosol-generating procedures), if feasible, based on staffing availability.

## 2019-08-03 ENCOUNTER — Encounter: Payer: Self-pay | Admitting: Advanced Practice Midwife

## 2019-08-03 DIAGNOSIS — Z3403 Encounter for supervision of normal first pregnancy, third trimester: Secondary | ICD-10-CM | POA: Insufficient documentation

## 2019-08-03 DIAGNOSIS — Z3401 Encounter for supervision of normal first pregnancy, first trimester: Secondary | ICD-10-CM | POA: Insufficient documentation
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
## 2019-08-04 LAB — CERVICOVAGINAL ANCILLARY ONLY
Chlamydia: NEGATIVE
Comment: NEGATIVE
Comment: NEGATIVE
Comment: NORMAL
Neisseria Gonorrhea: NEGATIVE
Trichomonas: NEGATIVE

## 2019-08-05 LAB — URINE CULTURE

## 2019-08-15 ENCOUNTER — Ambulatory Visit (INDEPENDENT_AMBULATORY_CARE_PROVIDER_SITE_OTHER): Payer: Medicaid Other | Admitting: Certified Nurse Midwife

## 2019-08-15 ENCOUNTER — Ambulatory Visit (INDEPENDENT_AMBULATORY_CARE_PROVIDER_SITE_OTHER): Payer: Medicaid Other

## 2019-08-15 ENCOUNTER — Other Ambulatory Visit: Payer: Self-pay

## 2019-08-15 ENCOUNTER — Other Ambulatory Visit: Payer: Self-pay | Admitting: Advanced Practice Midwife

## 2019-08-15 VITALS — BP 128/72 | Wt 172.0 lb

## 2019-08-15 DIAGNOSIS — Z3401 Encounter for supervision of normal first pregnancy, first trimester: Secondary | ICD-10-CM

## 2019-08-15 DIAGNOSIS — Z3A09 9 weeks gestation of pregnancy: Secondary | ICD-10-CM

## 2019-08-15 NOTE — Progress Notes (Signed)
Dating scan today. No complaints 

## 2019-08-16 NOTE — Progress Notes (Signed)
Dating scan today at 10 weeks. Patient reports that her LMP is give or take 5-6 days.  On ultrasound there is a SIUP with CRL c/w 9wk1d FCA 176 Will change EDC to reflect [place] EDC since LMP is unsure Desires MaterniT 21 testing Will get at next visit with NOB labs and 1 hour GTT in 2 weeks  Farrel Conners, CNM

## 2019-08-30 ENCOUNTER — Other Ambulatory Visit: Payer: Self-pay

## 2019-08-30 ENCOUNTER — Ambulatory Visit (INDEPENDENT_AMBULATORY_CARE_PROVIDER_SITE_OTHER): Payer: Medicaid Other | Admitting: Obstetrics and Gynecology

## 2019-08-30 ENCOUNTER — Other Ambulatory Visit: Payer: Medicaid Other

## 2019-08-30 ENCOUNTER — Encounter: Payer: Self-pay | Admitting: Obstetrics and Gynecology

## 2019-08-30 ENCOUNTER — Other Ambulatory Visit: Payer: Self-pay | Admitting: Obstetrics and Gynecology

## 2019-08-30 VITALS — BP 120/80 | Wt 172.0 lb

## 2019-08-30 DIAGNOSIS — Z0189 Encounter for other specified special examinations: Secondary | ICD-10-CM

## 2019-08-30 DIAGNOSIS — Z1379 Encounter for other screening for genetic and chromosomal anomalies: Secondary | ICD-10-CM

## 2019-08-30 DIAGNOSIS — Z113 Encounter for screening for infections with a predominantly sexual mode of transmission: Secondary | ICD-10-CM

## 2019-08-30 DIAGNOSIS — Z3401 Encounter for supervision of normal first pregnancy, first trimester: Secondary | ICD-10-CM

## 2019-08-30 DIAGNOSIS — O99211 Obesity complicating pregnancy, first trimester: Secondary | ICD-10-CM

## 2019-08-30 DIAGNOSIS — Z6830 Body mass index (BMI) 30.0-30.9, adult: Secondary | ICD-10-CM

## 2019-08-30 DIAGNOSIS — Z3A11 11 weeks gestation of pregnancy: Secondary | ICD-10-CM

## 2019-08-30 LAB — POCT URINALYSIS DIPSTICK OB
Glucose, UA: NEGATIVE
POC,PROTEIN,UA: NEGATIVE

## 2019-08-30 MED ORDER — CONCEPT OB 130-92.4-1 MG PO CAPS: 1.0000 | ORAL_CAPSULE | Freq: Every day | ORAL | 11 refills | Status: DC

## 2019-08-30 NOTE — Progress Notes (Signed)
Routine Prenatal Care Visit  Subjective  Sarah Oneill is a 19 y.o. G1P0000 at [redacted]w[redacted]d being seen today for ongoing prenatal care.  She is currently monitored for the following issues for this low-risk pregnancy and has Anxiety state; ADHD (attention deficit hyperactivity disorder); Astigmatism; Bee sting allergy; BMI (body mass index), pediatric, greater than or equal to 95% for age; Chronic constipation; Insulin resistance; Recurrent UTI; Shellfish allergy; and Encounter for supervision of normal first pregnancy in first trimester on their problem list.  ----------------------------------------------------------------------------------- Patient reports nausea.  Once daily vomiting. Usually depends on the foods she eats. Staying hydrated. No weight loss.  Contractions: Not present. Vag. Bleeding: None.  Movement: Absent. Denies leaking of fluid.  ----------------------------------------------------------------------------------- The following portions of the patient's history were reviewed and updated as appropriate: allergies, current medications, past family history, past medical history, past social history, past surgical history and problem list. Problem list updated.   Objective  Blood pressure 120/80, weight 172 lb (78 kg), last menstrual period 06/06/2019. Pregravid weight 172 lb (78 kg) Total Weight Gain 0 lb (0 kg) Urinalysis:      Fetal Status: Fetal Heart Rate (bpm): 156   Movement: Absent     General:  Alert, oriented and cooperative. Patient is in no acute distress.  Skin: Skin is warm and dry. No rash noted.   Cardiovascular: Normal heart rate noted  Respiratory: Normal respiratory effort, no problems with respiration noted  Abdomen: Soft, gravid, appropriate for gestational age. Pain/Pressure: Absent     Pelvic:  Cervical exam deferred        Extremities: Normal range of motion.     Mental Status: Normal mood and affect. Normal behavior. Normal judgment and thought  content.     Assessment   19 y.o. G1P0000 at [redacted]w[redacted]d by  03/18/2020, by Ultrasound presenting for routine prenatal visit  Plan   Pregnancy #1 Problems (from 08/02/19 to present)    Problem Noted Resolved   Encounter for supervision of normal first pregnancy in first trimester 08/03/2019 by Tresea Mall, CNM No   Overview Addendum 08/30/2019  9:34 AM by Natale Milch, MD    [facility]inic [facility] Prenatal Labs  Dating  Blood type:     Genetic Screen 1 Screen:    AFP:     Quad:     NIPS: Antibody:   Anatomic [place]  Rubella:   Varicella: @VZVIGG @  GTT Early:                28 wk:  RPR:     Rhogam  HBsAg:     Vaccines TDAP:                       Flu Shot: HIV:     Baby Food Breast                               GBS:   Contraception  Pap: < 21 y.o.  CBB     CS/VBAC NA   Support Person             Given [facility] because of shellfish allergy.  NOB labs and Maternit21 test today.   Gestational age appropriate obstetric precautions including but not limited to vaginal bleeding, contractions, leaking of fluid and fetal movement were reviewed in detail with the patient.    Return in about 4 weeks (around 09/27/2019) for ROB in person.  Homero Fellers MD [facility] 08/30/2019, 9:33 AM

## 2019-08-30 NOTE — Patient Instructions (Signed)
First Trimester of Pregnancy The first trimester of pregnancy is from week 1 until the end of week 13 (months 1 through 3). A week after a sperm fertilizes an egg, the egg will implant on the wall of the uterus. This embryo will begin to develop into a baby. Genes from you and your partner will form the baby. The female genes will determine whether the baby will be a boy or a girl. At 6-8 weeks, the eyes and face will be formed, and the heartbeat can be seen on ultrasound. At the end of 12 weeks, all the baby's organs will be formed. Now that you are pregnant, you will want to do everything you can to have a healthy baby. Two of the most important things are to get good prenatal care and to follow your health care provider's instructions. Prenatal care is all the medical care you receive before the baby's birth. This care will help prevent, find, and treat any problems during the pregnancy and childbirth. Body changes during your first trimester Your body goes through many changes during pregnancy. The changes vary from woman to woman.  You may gain or lose a couple of pounds at first.  You may feel sick to your stomach (nauseous) and you may throw up (vomit). If the vomiting is uncontrollable, call your health care provider.  You may tire easily.  You may develop headaches that can be relieved by medicines. All medicines should be approved by your health care provider.  You may urinate more often. Painful urination may mean you have a bladder infection.  You may develop heartburn as a result of your pregnancy.  You may develop constipation because certain hormones are causing the muscles that push stool through your intestines to slow down.  You may develop hemorrhoids or swollen veins (varicose veins).  Your breasts may begin to grow larger and become tender. Your nipples may stick out more, and the tissue that surrounds them (areola) may become darker.  Your gums may bleed and may be  sensitive to brushing and flossing.  Dark spots or blotches (chloasma, mask of pregnancy) may develop on your face. This will likely fade after the baby is born.  Your menstrual periods will stop.  You may have a loss of appetite.  You may develop cravings for certain kinds of food.  You may have changes in your emotions from day to day, such as being excited to be pregnant or being concerned that something may go wrong with the pregnancy and baby.  You may have more vivid and strange dreams.  You may have changes in your hair. These can include thickening of your hair, rapid growth, and changes in texture. Some women also have hair loss during or after pregnancy, or hair that feels dry or thin. Your hair will most likely return to normal after your baby is born. What to expect at prenatal visits During a routine prenatal visit:  You will be weighed to make sure you and the baby are growing normally.  Your blood pressure will be taken.  Your abdomen will be measured to track your baby's growth.  The fetal heartbeat will be listened to between weeks 10 and 14 of your pregnancy.  Test results from any previous visits will be discussed. Your health care provider may ask you:  How you are feeling.  If you are feeling the baby move.  If you have had any abnormal symptoms, such as leaking fluid, bleeding, severe headaches, or abdominal   cramping.  If you are using any tobacco products, including cigarettes, chewing tobacco, and electronic cigarettes.  If you have any questions. Other tests that may be performed during your first trimester include:  Blood tests to find your blood type and to check for the presence of any previous infections. The tests will also be used to check for low iron levels (anemia) and protein on red blood cells (Rh antibodies). Depending on your risk factors, or if you previously had diabetes during pregnancy, you may have tests to check for high blood sugar  that affects pregnant women (gestational diabetes).  Urine tests to check for infections, diabetes, or protein in the urine.  An ultrasound to confirm the proper growth and development of the baby.  Fetal screens for spinal cord problems (spina bifida) and Down syndrome.  HIV (human immunodeficiency virus) testing. Routine prenatal testing includes screening for HIV, unless you choose not to have this test.  You may need other tests to make sure you and the baby are doing well. Follow these instructions at home: Medicines  Follow your health care provider's instructions regarding medicine use. Specific medicines may be either safe or unsafe to take during pregnancy.  Take a prenatal vitamin that contains at least 600 micrograms (mcg) of folic acid.  If you develop constipation, try taking a stool softener if your health care provider approves. Eating and drinking   Eat a balanced diet that includes fresh fruits and vegetables, whole grains, good sources of protein such as meat, eggs, or tofu, and low-fat dairy. Your health care provider will help you determine the amount of weight gain that is right for you.  Avoid raw meat and uncooked cheese. These carry germs that can cause birth defects in the baby.  Eating four or five small meals rather than three large meals a day may help relieve nausea and vomiting. If you start to feel nauseous, eating a few soda crackers can be helpful. Drinking liquids between meals, instead of during meals, also seems to help ease nausea and vomiting.  Limit foods that are high in fat and processed sugars, such as fried and sweet foods.  To prevent constipation: ? Eat foods that are high in fiber, such as fresh fruits and vegetables, whole grains, and beans. ? Drink enough fluid to keep your urine clear or pale yellow. Activity  Exercise only as directed by your health care provider. Most women can continue their usual exercise routine during  pregnancy. Try to exercise for 30 minutes at least 5 days a week. Exercising will help you: ? Control your weight. ? Stay in shape. ? Be prepared for labor and delivery.  Experiencing pain or cramping in the lower abdomen or lower back is a good sign that you should stop exercising. Check with your health care provider before continuing with normal exercises.  Try to avoid standing for long periods of time. Move your legs often if you must stand in one place for a long time.  Avoid heavy lifting.  Wear low-heeled shoes and practice good posture.  You may continue to have sex unless your health care provider tells you not to. Relieving pain and discomfort  Wear a good support bra to relieve breast tenderness.  Take warm sitz baths to soothe any pain or discomfort caused by hemorrhoids. Use hemorrhoid cream if your health care provider approves.  Rest with your legs elevated if you have leg cramps or low back pain.  If you develop varicose veins in   your legs, wear support hose. Elevate your feet for 15 minutes, 3-4 times a day. Limit salt in your diet. Prenatal care  Schedule your prenatal visits by the twelfth week of pregnancy. They are usually scheduled monthly at first, then more often in the last 2 months before delivery.  Write down your questions. Take them to your prenatal visits.  Keep all your prenatal visits as told by your health care provider. This is important. Safety  Wear your seat belt at all times when driving.  Make a list of emergency phone numbers, including numbers for family, friends, the hospital, and police and fire departments. General instructions  Ask your health care provider for a referral to a local prenatal education class. Begin classes no later than the beginning of month 6 of your pregnancy.  Ask for help if you have counseling or nutritional needs during pregnancy. Your health care provider can offer advice or refer you to specialists for help  with various needs.  Do not use hot tubs, steam rooms, or saunas.  Do not douche or use tampons or scented sanitary pads.  Do not cross your legs for long periods of time.  Avoid cat litter boxes and soil used by cats. These carry germs that can cause birth defects in the baby and possibly loss of the fetus by miscarriage or stillbirth.  Avoid all smoking, herbs, alcohol, and medicines not prescribed by your health care provider. Chemicals in these products affect the formation and growth of the baby.  Do not use any products that contain nicotine or tobacco, such as cigarettes and e-cigarettes. If you need help quitting, ask your health care provider. You may receive counseling support and other resources to help you quit.  Schedule a dentist appointment. At home, brush your teeth with a soft toothbrush and be gentle when you floss. Contact a health care provider if:  You have dizziness.  You have mild pelvic cramps, pelvic pressure, or nagging pain in the abdominal area.  You have persistent nausea, vomiting, or diarrhea.  You have a bad smelling vaginal discharge.  You have pain when you urinate.  You notice increased swelling in your face, hands, legs, or ankles.  You are exposed to fifth disease or chickenpox.  You are exposed to German measles (rubella) and have never had it. Get help right away if:  You have a fever.  You are leaking fluid from your vagina.  You have spotting or bleeding from your vagina.  You have severe abdominal cramping or pain.  You have rapid weight gain or loss.  You vomit blood or material that looks like coffee grounds.  You develop a severe headache.  You have shortness of breath.  You have any kind of trauma, such as from a fall or a car accident. Summary  The first trimester of pregnancy is from week 1 until the end of week 13 (months 1 through 3).  Your body goes through many changes during pregnancy. The changes vary from  woman to woman.  You will have routine prenatal visits. During those visits, your health care provider will examine you, discuss any test results you may have, and talk with you about how you are feeling. This information is not intended to replace advice given to you by your health care provider. Make sure you discuss any questions you have with your health care provider. Document Revised: 03/20/2017 Document Reviewed: 03/19/2016 Elsevier Patient Education  2020 [employer]  

## 2019-09-01 ENCOUNTER — Telehealth: Payer: Self-pay

## 2019-09-01 LAB — RPR+RH+ABO+RUB AB+AB SCR+CB...
Antibody Screen: NEGATIVE
HIV Screen 4th Generation wRfx: NONREACTIVE
Hematocrit: 39.1 % (ref 34.0–46.6)
Hemoglobin: 12.9 g/dL (ref 11.1–15.9)
Hepatitis B Surface Ag: NEGATIVE
MCH: 29.5 pg (ref 26.6–33.0)
MCHC: 33 g/dL (ref 31.5–35.7)
MCV: 89 fL (ref 79–97)
Platelets: 211 10*3/uL (ref 150–450)
RBC: 4.38 x10E6/uL (ref 3.77–5.28)
RDW: 13.2 % (ref 11.7–15.4)
RPR Ser Ql: NONREACTIVE
Rh Factor: POSITIVE
Rubella Antibodies, IGG: 2.44 index (ref >0.99)
Varicella zoster IgG: 191 index (ref >165)
WBC: 7.1 10*3/uL (ref 3.4–10.8)

## 2019-09-01 LAB — HGB FRACTIONATION CASCADE
Hgb A2: 2.9 % (ref 1.8–3.2)
Hgb A: 97.1 % (ref 96.4–98.8)
Hgb F: 0 % (ref 0.0–2.0)
Hgb S: 0 %

## 2019-09-01 LAB — GLUCOSE, 1 HOUR GESTATIONAL: Gestational Diabetes Screen: 48 mg/dL — ABNORMAL LOW (ref 65–139)

## 2019-09-01 NOTE — Telephone Encounter (Signed)
-----   Message from Natale Milch, MD sent at 09/01/2019  2:52 PM EDT ----- Please call patient with normal results. Sincerely,  Dr. Jerene Pitch

## 2019-09-01 NOTE — Progress Notes (Signed)
Please call patient with normal results. Sincerely,  Dr. Jerene Pitch

## 2019-09-01 NOTE — Telephone Encounter (Signed)
LMVM TRC. 

## 2019-09-04 LAB — MATERNIT21 PLUS CORE+SCA
Fetal Fraction: 12
Monosomy X (Turner Syndrome): NOT DETECTED
Result (T21): NEGATIVE
Trisomy 13 (Patau syndrome): NEGATIVE
Trisomy 18 (Edwards syndrome): NEGATIVE
Trisomy 21 (Down syndrome): NEGATIVE
XXX (Triple X Syndrome): NOT DETECTED
XXY (Klinefelter Syndrome): NOT DETECTED
XYY (Jacobs Syndrome): NOT DETECTED

## 2019-09-05 NOTE — Progress Notes (Signed)
Called and discussed result with patient.

## 2019-09-06 IMAGING — CR DG ABDOMEN 1V
1 series · 1 of 1 positions shown · non-contrast
Comparison: None.

CLINICAL DATA: Pain, constipation and urinary esophagus meeting.

EXAM:
ABDOMEN - 1 VIEW

[abdomen kub]
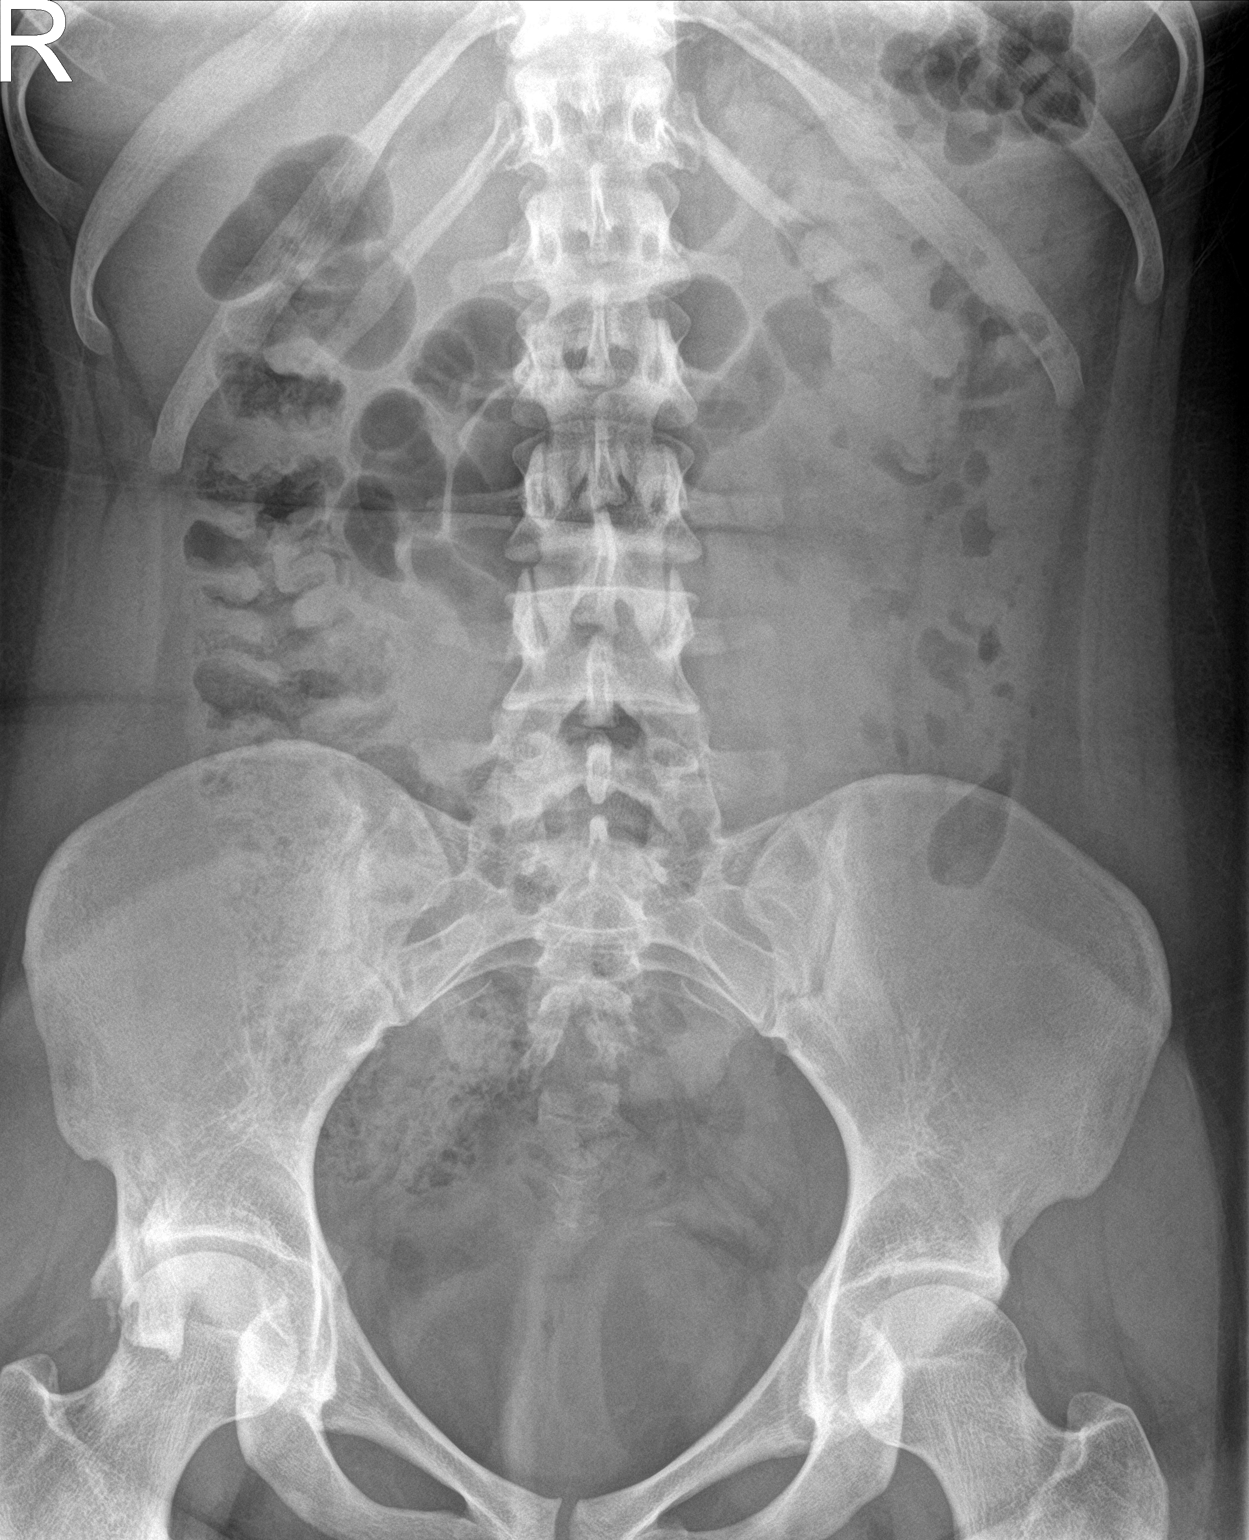

[1 of 1 positions shown; findings below may reference images not displayed]

FINDINGS: Moderate stool retention within the cecum, ascending and descending
colon. No bowel obstruction, organomegaly nor radiopaque calculi. No
acute osseous abnormality.
IMPRESSION: Moderate stool retention within the cecum, ascending and descending
colon.

## 2019-09-08 LAB — HSV(HERPES SMPLX)ABS-I+II(IGG+IGM)-BLD
HSV 1 Glycoprotein G Ab, IgG: 41.6 index — ABNORMAL HIGH (ref 0.00–0.90)
HSV 2 IgG, Type Spec: 1.51 index — ABNORMAL HIGH (ref 0.00–0.90)
HSVI/II Comb IgM: 1.09 Ratio — ABNORMAL HIGH (ref 0.00–0.90)

## 2019-09-08 LAB — HSV-2 IGG SUPPLEMENTAL TEST: HSV-2 IgG Supplemental Test: POSITIVE — AB

## 2019-09-08 LAB — SPECIMEN STATUS REPORT

## 2019-09-27 ENCOUNTER — Encounter: Payer: Self-pay | Admitting: Obstetrics and Gynecology

## 2019-09-27 ENCOUNTER — Other Ambulatory Visit: Payer: Self-pay

## 2019-09-27 ENCOUNTER — Ambulatory Visit (INDEPENDENT_AMBULATORY_CARE_PROVIDER_SITE_OTHER): Payer: Medicaid Other | Admitting: Obstetrics and Gynecology

## 2019-09-27 VITALS — BP 115/70 | Ht 63.0 in | Wt 174.4 lb

## 2019-09-27 DIAGNOSIS — Z3A15 15 weeks gestation of pregnancy: Secondary | ICD-10-CM

## 2019-09-27 DIAGNOSIS — Z3401 Encounter for supervision of normal first pregnancy, first trimester: Secondary | ICD-10-CM

## 2019-09-27 NOTE — Progress Notes (Signed)
Routine Prenatal Care Visit  Subjective  Sarah Oneill is a 19 y.o. G1P0000 at [redacted]w[redacted]d being seen today for ongoing prenatal care.  She is currently monitored for the following issues for this low-risk pregnancy and has Anxiety state; ADHD (attention deficit hyperactivity disorder); Astigmatism; Bee sting allergy; BMI (body mass index), pediatric, greater than or equal to 95% for age; Chronic constipation; Insulin resistance; Recurrent UTI; Shellfish allergy; and Encounter for supervision of normal first pregnancy in first trimester on their problem list.  ----------------------------------------------------------------------------------- Patient reports occassional cramps or back pain.   Contractions: Not present. Vag. Bleeding: None.  Movement: Present. Denies leaking of fluid.  ----------------------------------------------------------------------------------- The following portions of the patient's history were reviewed and updated as appropriate: allergies, current medications, past family history, past medical history, past social history, past surgical history and problem list. Problem list updated.   Objective  Blood pressure 115/70, height 5\' 3"  (1.6 m), weight 174 lb 6.4 oz (79.1 kg), last menstrual period 06/06/2019. Pregravid weight 172 lb (78 kg) Total Weight Gain 2 lb 6.4 oz (1.089 kg) Urinalysis:      Fetal Status: Fetal Heart Rate (bpm): 155   Movement: Present     General:  Alert, oriented and cooperative. Patient is in no acute distress.  Skin: Skin is warm and dry. No rash noted.   Cardiovascular: Normal heart rate noted  Respiratory: Normal respiratory effort, no problems with respiration noted  Abdomen: Soft, gravid, appropriate for gestational age. Pain/Pressure: Absent     Pelvic:  Cervical exam deferred        Extremities: Normal range of motion.  Edema: None  Mental Status: Normal mood and affect. Normal behavior. Normal judgment and thought content.      Assessment   19 y.o. G1P0000 at [redacted]w[redacted]d by  03/18/2020, by Ultrasound presenting for routine prenatal visit  Plan   Pregnancy #1 Problems (from 08/02/19 to present)    Problem Noted Resolved   Encounter for supervision of normal first pregnancy in first trimester 08/03/2019 by 08/05/2019, CNM No   Overview Addendum 09/27/2019  9:12 AM by 11/27/2019, MD    [facility] Prenatal Labs  Dating  9 wk Natale Milch Blood type: O/Positive/-- (05/11 1023)   Genetic Screen  NIPS: normal XY Antibody:Negative (05/11 1023)  Anatomic 06-17-1975  Rubella: 2.44 (05/11 1023)  Varicella: Immune  GTT Early:  48              28 wk:  RPR: Non Reactive (05/11 1023)   Rhogam Not needed HBsAg: Negative (05/11 1023)   Vaccines TDAP:                       Flu Shot: HIV: Non Reactive (05/11 1023)   Baby Food Breast                               GBS:   Contraception  Pap: < 21 y.o.  CBB    Valtrex @ 36 weeks [ ]    CS/VBAC NA HgbE: normal  Support Person Jentrey             Discussed COVID vaccination Discussed prenatal classes Given information about birth control postpartum Anatomy 06-17-1975 next visit  Gestational age appropriate obstetric precautions including but not limited to vaginal bleeding, contractions, leaking of fluid and fetal movement were reviewed in detail with the patient.    Return in about 4  weeks (around 10/25/2019) for ROB and anatomy US in person.  Homero Fellers MD [facility], [facility] 09/27/2019, 9:20 AM

## 2019-09-27 NOTE — Patient Instructions (Addendum)

## 2019-10-10 ENCOUNTER — Encounter: Payer: Self-pay | Admitting: Advanced Practice Midwife

## 2019-10-10 DIAGNOSIS — A609 Anogenital herpesviral infection, unspecified: Secondary | ICD-10-CM | POA: Insufficient documentation

## 2019-10-25 ENCOUNTER — Encounter: Payer: Self-pay | Admitting: Obstetrics & Gynecology

## 2019-10-25 ENCOUNTER — Ambulatory Visit (INDEPENDENT_AMBULATORY_CARE_PROVIDER_SITE_OTHER): Payer: Medicaid Other

## 2019-10-25 ENCOUNTER — Other Ambulatory Visit: Payer: Self-pay

## 2019-10-25 ENCOUNTER — Ambulatory Visit (INDEPENDENT_AMBULATORY_CARE_PROVIDER_SITE_OTHER): Payer: Medicaid Other | Admitting: Obstetrics & Gynecology

## 2019-10-25 ENCOUNTER — Telehealth: Payer: Self-pay | Admitting: Obstetrics & Gynecology

## 2019-10-25 VITALS — BP 120/80 | Wt 181.0 lb

## 2019-10-25 DIAGNOSIS — Z3401 Encounter for supervision of normal first pregnancy, first trimester: Secondary | ICD-10-CM

## 2019-10-25 DIAGNOSIS — Z3402 Encounter for supervision of normal first pregnancy, second trimester: Secondary | ICD-10-CM

## 2019-10-25 DIAGNOSIS — Z3A19 19 weeks gestation of pregnancy: Secondary | ICD-10-CM

## 2019-10-25 DIAGNOSIS — Z131 Encounter for screening for diabetes mellitus: Secondary | ICD-10-CM

## 2019-10-25 LAB — POCT URINALYSIS DIPSTICK OB
Glucose, UA: NEGATIVE
POC,PROTEIN,UA: NEGATIVE

## 2019-10-25 NOTE — Progress Notes (Signed)
  Subjective  Fetal Movement? yes Contractions? no Leaking Fluid? no Vaginal Bleeding? no  Objective  BP 120/80   Wt 181 lb (82.1 kg)   LMP 06/06/2019 (Approximate)   BMI 32.06 kg/m  General: NAD Pumonary: no increased work of breathing Abdomen: gravid, non-tender Extremities: no edema Psychiatric: mood appropriate, affect full  Review of ULTRASOUND. I have personally reviewed images and report of recent ultrasound done at [facility]. There is a singleton gestation with subjectively normal amniotic fluid volume. The fetal biometry correlates with established dating. Detailed evaluation of the fetal anatomy was performed.The fetal anatomical survey appears within normal limits within the resolution of ultrasound as described above.  It must be noted that a normal ultrasound is unable to rule out fetal aneuploidy.     Assessment  19 y.o. G1P0000 at [redacted]w[redacted]d by  03/18/2020, by Ultrasound presenting for routine prenatal visit  Plan   Problem List Items Addressed This Visit      Other   Encounter for supervision of normal first pregnancy in first trimester    Other Visit Diagnoses    [redacted] weeks gestation of pregnancy    -  Primary   Screening for diabetes mellitus       Relevant Orders   28 Week RH+Panel      Pregnancy #1 Problems (from 08/02/19 to present)    Problem Noted Resolved   HSV (herpes simplex virus) anogenital infection 10/10/2019 by Tresea Mall, CNM No   Encounter for supervision of normal first pregnancy in first trimester 08/03/2019 by Tresea Mall, CNM No   Overview Addendum 10/25/2019 10:04 AM by Nadara Mustard, MD    [facility] Prenatal Labs  Dating  9 wk [place] Blood type: O/Positive/-- (05/11 1023)   Genetic Screen  NIPS: normal XY Antibody:Negative (05/11 1023)  Anatomic [place] [facility] nml Rubella: 2.44 (05/11 1023)  Varicella: Immune  GTT Early:  48              28 wk:  RPR: Non Reactive (05/11 1023)   Rhogam Not needed HBsAg: Negative (05/11 1023)    Vaccines TDAP:                       Flu Shot: HIV: Non Reactive (05/11 1023)   Baby Food Breast                               GBS:   Contraception  Pap: < 21 y.o.  CBB  No  Valtrex @ 36 weeks [ ]    CS/VBAC NA HgbE: normal  Support Person Sarah Oneill         PNV Glucola in 2 mos  , MD, [facility], [facility] 10/25/2019  10:12 AM

## 2019-10-25 NOTE — Addendum Note (Signed)
Addended by: Cornelius Moras D on: 10/25/2019 10:15 AM   Modules accepted: Orders

## 2019-10-25 NOTE — Telephone Encounter (Signed)
Pt has been made aware medicaid is OON and has to be changed in order to continue care with [place].

## 2019-10-25 NOTE — Patient Instructions (Signed)
Thank you for choosing [facility]. As part of our ongoing efforts to improve patient experience, we would appreciate your feedback. Please fill out the short survey that you will receive by mail or MyChart. Your opinion is important to us! -Dr Tedric Leeth  Second Trimester of Pregnancy The second trimester is from week 14 through week 27 (months 4 through 6). The second trimester is often a time when you feel your best. Your body has adjusted to being pregnant, and you begin to feel better physically. Usually, morning sickness has lessened or quit completely, you may have more energy, and you may have an increase in appetite. The second trimester is also a time when the fetus is growing rapidly. At the end of the sixth month, the fetus is about 9 inches long and weighs about 1 pounds. You will likely begin to feel the baby move (quickening) between 16 and 20 weeks of pregnancy. Body changes during your second trimester Your body continues to go through many changes during your second trimester. The changes vary from woman to woman.  Your weight will continue to increase. You will notice your lower abdomen bulging out.  You may begin to get stretch marks on your hips, abdomen, and breasts.  You may develop headaches that can be relieved by medicines. The medicines should be approved by your health care provider.  You may urinate more often because the fetus is pressing on your bladder.  You may develop or continue to have heartburn as a result of your pregnancy.  You may develop constipation because certain hormones are causing the muscles that push waste through your intestines to slow down.  You may develop hemorrhoids or swollen, bulging veins (varicose veins).  You may have back pain. This is caused by: ? Weight gain. ? Pregnancy hormones that are relaxing the joints in your pelvis. ? A shift in weight and the muscles that support your balance.  Your breasts will continue to grow and  they will continue to become tender.  Your gums may bleed and may be sensitive to brushing and flossing.  Dark spots or blotches (chloasma, mask of pregnancy) may develop on your face. This will likely fade after the baby is born.  A dark line from your belly button to the pubic area (linea nigra) may appear. This will likely fade after the baby is born.  You may have changes in your hair. These can include thickening of your hair, rapid growth, and changes in texture. Some women also have hair loss during or after pregnancy, or hair that feels dry or thin. Your hair will most likely return to normal after your baby is born. What to expect at prenatal visits During a routine prenatal visit:  You will be weighed to make sure you and the fetus are growing normally.  Your blood pressure will be taken.  Your abdomen will be measured to track your baby's growth.  The fetal heartbeat will be listened to.  Any test results from the previous visit will be discussed. Your health care provider may ask you:  How you are feeling.  If you are feeling the baby move.  If you have had any abnormal symptoms, such as leaking fluid, bleeding, severe headaches, or abdominal cramping.  If you are using any tobacco products, including cigarettes, chewing tobacco, and electronic cigarettes.  If you have any questions. Other tests that may be performed during your second trimester include:  Blood tests that check for: ? Low iron levels (  anemia). ? High blood sugar that affects pregnant women (gestational diabetes) between 24 and 28 weeks. ? Rh antibodies. This is to check for a protein on red blood cells (Rh factor).  Urine tests to check for infections, diabetes, or protein in the urine.  An ultrasound to confirm the proper growth and development of the baby.  An amniocentesis to check for possible genetic problems.  Fetal screens for spina bifida and Down syndrome.  HIV (human  immunodeficiency virus) testing. Routine prenatal testing includes screening for HIV, unless you choose not to have this test. Follow these instructions at home: Medicines  Follow your health care provider's instructions regarding medicine use. Specific medicines may be either safe or unsafe to take during pregnancy.  Take a prenatal vitamin that contains at least 600 micrograms (mcg) of folic acid.  If you develop constipation, try taking a stool softener if your health care provider approves. Eating and drinking   Eat a balanced diet that includes fresh fruits and vegetables, whole grains, good sources of protein such as meat, eggs, or tofu, and low-fat dairy. Your health care provider will help you determine the amount of weight gain that is right for you.  Avoid raw meat and uncooked cheese. These carry germs that can cause birth defects in the baby.  If you have low calcium intake from food, talk to your health care provider about whether you should take a daily calcium supplement.  Limit foods that are high in fat and processed sugars, such as fried and sweet foods.  To prevent constipation: ? Drink enough fluid to keep your urine clear or pale yellow. ? Eat foods that are high in fiber, such as fresh fruits and vegetables, whole grains, and beans. Activity  Exercise only as directed by your health care provider. Most women can continue their usual exercise routine during pregnancy. Try to exercise for 30 minutes at least 5 days a week. Stop exercising if you experience uterine contractions.  Avoid heavy lifting, wear low heel shoes, and practice good posture.  A sexual relationship may be continued unless your health care provider directs you otherwise. Relieving pain and discomfort  Wear a good support bra to prevent discomfort from breast tenderness.  Take warm sitz baths to soothe any pain or discomfort caused by hemorrhoids. Use hemorrhoid cream if your health care  provider approves.  Rest with your legs elevated if you have leg cramps or low back pain.  If you develop varicose veins, wear support hose. Elevate your feet for 15 minutes, 3-4 times a day. Limit salt in your diet. Prenatal Care  Write down your questions. Take them to your prenatal visits.  Keep all your prenatal visits as told by your health care provider. This is important. Safety  Wear your seat belt at all times when driving.  Make a list of emergency phone numbers, including numbers for family, friends, the hospital, and police and fire departments. General instructions  Ask your health care provider for a referral to a local prenatal education class. Begin classes no later than the beginning of month 6 of your pregnancy.  Ask for help if you have counseling or nutritional needs during pregnancy. Your health care provider can offer advice or refer you to specialists for help with various needs.  Do not use hot tubs, steam rooms, or saunas.  Do not douche or use tampons or scented sanitary pads.  Do not cross your legs for long periods of time.  Avoid cat litter   boxes and soil used by cats. These carry germs that can cause birth defects in the baby and possibly loss of the fetus by miscarriage or stillbirth.  Avoid all smoking, herbs, alcohol, and unprescribed drugs. Chemicals in these products can affect the formation and growth of the baby.  Do not use any products that contain nicotine or tobacco, such as cigarettes and e-cigarettes. If you need help quitting, ask your health care provider.  Visit your dentist if you have not gone yet during your pregnancy. Use a soft toothbrush to brush your teeth and be gentle when you floss. Contact a health care provider if:  You have dizziness.  You have mild pelvic cramps, pelvic pressure, or nagging pain in the abdominal area.  You have persistent nausea, vomiting, or diarrhea.  You have a bad smelling vaginal  discharge.  You have pain when you urinate. Get help right away if:  You have a fever.  You are leaking fluid from your vagina.  You have spotting or bleeding from your vagina.  You have severe abdominal cramping or pain.  You have rapid weight gain or weight loss.  You have shortness of breath with chest pain.  You notice sudden or extreme swelling of your face, hands, ankles, feet, or legs.  You have not felt your baby move in over an hour.  You have severe headaches that do not go away when you take medicine.  You have vision changes. Summary  The second trimester is from week 14 through week 27 (months 4 through 6). It is also a time when the fetus is growing rapidly.  Your body goes through many changes during pregnancy. The changes vary from woman to woman.  Avoid all smoking, herbs, alcohol, and unprescribed drugs. These chemicals affect the formation and growth your baby.  Do not use any tobacco products, such as cigarettes, chewing tobacco, and e-cigarettes. If you need help quitting, ask your health care provider.  Contact your health care provider if you have any questions. Keep all prenatal visits as told by your health care provider. This is important. This information is not intended to replace advice given to you by your health care provider. Make sure you discuss any questions you have with your health care provider. Document Revised: 07/30/2018 Document Reviewed: 05/13/2016 Elsevier Patient Education  2020 [employer]  

## 2019-10-28 ENCOUNTER — Other Ambulatory Visit: Payer: Self-pay | Admitting: Advanced Practice Midwife

## 2019-10-28 DIAGNOSIS — B009 Herpesviral infection, unspecified: Secondary | ICD-10-CM

## 2019-10-28 MED ORDER — VALACYCLOVIR HCL 500 MG PO TABS: 500.0000 mg | ORAL_TABLET | Freq: Two times a day (BID) | ORAL | 6 refills | Status: DC

## 2019-10-28 NOTE — Progress Notes (Signed)
Spoke with patient about diagnosis of HSV and she is aware of prescription that has been sent to her pharmacy.

## 2019-11-22 ENCOUNTER — Other Ambulatory Visit: Payer: Self-pay

## 2019-11-22 ENCOUNTER — Ambulatory Visit (INDEPENDENT_AMBULATORY_CARE_PROVIDER_SITE_OTHER): Payer: Medicaid Other | Admitting: Obstetrics & Gynecology

## 2019-11-22 ENCOUNTER — Encounter: Payer: Self-pay | Admitting: Obstetrics & Gynecology

## 2019-11-22 VITALS — BP 120/80 | Wt 190.0 lb

## 2019-11-22 DIAGNOSIS — Z3A23 23 weeks gestation of pregnancy: Secondary | ICD-10-CM

## 2019-11-22 DIAGNOSIS — Z3402 Encounter for supervision of normal first pregnancy, second trimester: Secondary | ICD-10-CM

## 2019-11-22 NOTE — Progress Notes (Signed)
  Subjective  Fetal Movement? yes Contractions? no Leaking Fluid? no Vaginal Bleeding? no  Objective  BP 120/80   Wt 190 lb (86.2 kg)   LMP 06/06/2019 (Approximate)   BMI 33.66 kg/m  General: NAD Pumonary: no increased work of breathing Abdomen: gravid, non-tender Extremities: no edema Psychiatric: mood appropriate, affect full  Assessment  19 y.o. G1P0000 at [redacted]w[redacted]d by  03/18/2020, by Ultrasound presenting for routine prenatal visit  Plan   Problem List Items Addressed This Visit    None    Visit Diagnoses    [redacted] weeks gestation of pregnancy    -  Primary   Encounter for supervision of normal first pregnancy in second trimester          Pregnancy #1 Problems (from 08/02/19 to present)    Problem Noted Resolved   HSV (herpes simplex virus) anogenital infection 10/10/2019 by Tresea Mall, CNM No   Encounter for supervision of normal first pregnancy in first trimester 08/03/2019 by Tresea Mall, CNM No   Overview Addendum 10/25/2019 10:04 AM by Nadara Mustard, MD    Clinic [facility] Prenatal Labs  Dating  9 wk [place] Blood type: O/Positive/-- (05/11 1023)   Genetic Screen  NIPS: normal XY Antibody:Negative (05/11 1023)  Anatomic [place] [facility] nml Rubella: 2.44 (05/11 1023)  Varicella: Immune  GTT Early:  48              28 wk:  RPR: Non Reactive (05/11 1023)   Rhogam Not needed HBsAg: Negative (05/11 1023)   Vaccines TDAP:                       Flu Shot: HIV: Non Reactive (05/11 1023)   Baby Food Breast                               GBS:   Contraception  Pap: < 21 y.o.  CBB  No  Valtrex @ 36 weeks [ ]    CS/VBAC NA HgbE: normal  Support Person Jentrey          Previous Version    GLUCOLA NV PNV HSV discussed, options for Valtrex now or at 36 weeks (will wait til 36 weeks)  , MD, [facility], [facility] 11/22/2019  9:12 AM

## 2019-11-22 NOTE — Patient Instructions (Signed)

## 2019-12-20 ENCOUNTER — Other Ambulatory Visit: Payer: Self-pay

## 2019-12-20 ENCOUNTER — Ambulatory Visit (INDEPENDENT_AMBULATORY_CARE_PROVIDER_SITE_OTHER): Payer: Medicaid Other | Admitting: Obstetrics

## 2019-12-20 ENCOUNTER — Other Ambulatory Visit: Payer: Medicaid Other

## 2019-12-20 VITALS — BP 118/70 | Wt 203.2 lb

## 2019-12-20 DIAGNOSIS — Z3402 Encounter for supervision of normal first pregnancy, second trimester: Secondary | ICD-10-CM

## 2019-12-20 DIAGNOSIS — Z3401 Encounter for supervision of normal first pregnancy, first trimester: Secondary | ICD-10-CM

## 2019-12-20 DIAGNOSIS — Z3A27 27 weeks gestation of pregnancy: Secondary | ICD-10-CM

## 2019-12-20 DIAGNOSIS — Z131 Encounter for screening for diabetes mellitus: Secondary | ICD-10-CM

## 2019-12-20 NOTE — Progress Notes (Signed)
Routine Prenatal Care Visit  Subjective  Sarah Oneill is a 19 y.o. G1P0000 at [redacted]w[redacted]d being seen today for ongoing prenatal care.  She is currently monitored for the following issues for this low-risk pregnancy and has Anxiety state; ADHD (attention deficit hyperactivity disorder); Astigmatism; Bee sting allergy; BMI (body mass index), pediatric, greater than or equal to 95% for age; Chronic constipation; Insulin resistance; Recurrent UTI; Shellfish allergy; Encounter for supervision of normal first pregnancy in first trimester; and HSV (herpes simplex virus) anogenital infection on their problem list.  ----------------------------------------------------------------------------------- Patient reports no complaints.   Contractions: Not present. Vag. Bleeding: None.  Movement: Present. Leaking Fluid denies.  ----------------------------------------------------------------------------------- The following portions of the patient's history were reviewed and updated as appropriate: allergies, current medications, past family history, past medical history, past social history, past surgical history and problem list. Problem list updated.  Objective  Blood pressure 118/70, weight 203 lb 3.2 oz (92.2 kg), last menstrual period 06/06/2019. Pregravid weight 172 lb (78 kg) Total Weight Gain 31 lb 3.2 oz (14.2 kg) Urinalysis: Urine Protein    Urine Glucose    Fetal Status:     Movement: Present     General:  Alert, oriented and cooperative. Patient is in no acute distress.  Skin: Skin is warm and dry. No rash noted.   Cardiovascular: Normal heart rate noted  Respiratory: Normal respiratory effort, no problems with respiration noted  Abdomen: Soft, gravid, appropriate for gestational age. Pain/Pressure: Absent     Pelvic:  Cervical exam deferred        Extremities: Normal range of motion.     Mental Status: Normal mood and affect. Normal behavior. Normal judgment and thought content.   Assessment    19 y.o. G1P0000 at [redacted]w[redacted]d by  03/18/2020, by Ultrasound presenting for routine prenatal visit  Plan   Pregnancy #1 Problems (from 08/02/19 to present)    Problem Noted Resolved   HSV (herpes simplex virus) anogenital infection 10/10/2019 by Tresea Mall, CNM No   Encounter for supervision of normal first pregnancy in first trimester 08/03/2019 by Tresea Mall, CNM No   Overview Addendum 12/20/2019  9:32 AM by Mirna Mires, CNM    Clinic [facility] Prenatal Labs  Dating  9 wk [place] Blood type: O/Positive/-- (05/11 1023)   Genetic Screen  NIPS: normal XY Antibody:Negative (05/11 1023)  Anatomic [place] [facility] nml Rubella: 2.44 (05/11 1023)  Varicella: Immune  GTT 8/31 RPR: Non Reactive (05/11 1023)   Rhogam Not needed HBsAg: Negative (05/11 1023)   Vaccines TDAP:                       Flu Shot: HIV: Non Reactive (05/11 1023)   Baby Food Breast                               GBS:   Contraception  Pap: < 20 y.o.  CBB  No  Valtrex @ 36 weeks [ ]    CS/VBAC NA HgbE: normal  Support Person          Previous Version       Preterm labor symptoms and general obstetric precautions including but not limited to vaginal bleeding, contractions, leaking of fluid and fetal movement were reviewed in detail with the patient. Please refer to After Visit Summary for other counseling recommendations.  28 weeks labs drawn today.  Return in about 2 weeks (around 01/03/2020) for return  Willene Hatchet, CNM  12/20/2019 9:34 AM

## 2019-12-20 NOTE — Patient Instructions (Signed)
Third Trimester of Pregnancy  The third trimester is from week 28 through week 40 (months 7 through 9). This trimester is when your unborn baby (fetus) is growing very fast. At the end of the ninth month, the unborn baby is about 20 inches in length. It weighs about 6-10 pounds. Follow these instructions at home: Medicines  Take over-the-counter and prescription medicines only as told by your doctor. Some medicines are safe and some medicines are not safe during pregnancy.  Take a prenatal vitamin that contains at least 600 micrograms (mcg) of folic acid.  If you have trouble pooping (constipation), take medicine that will make your stool soft (stool softener) if your doctor approves. Eating and drinking   Eat regular, healthy meals.  Avoid raw meat and uncooked cheese.  If you get low calcium from the food you eat, talk to your doctor about taking a daily calcium supplement.  Eat four or five small meals rather than three large meals a day.  Avoid foods that are high in fat and sugars, such as fried and sweet foods.  To prevent constipation: ? Eat foods that are high in fiber, like fresh fruits and vegetables, whole grains, and beans. ? Drink enough fluids to keep your pee (urine) clear or pale yellow. Activity  Exercise only as told by your doctor. Stop exercising if you start to have cramps.  Avoid heavy lifting, wear low heels, and sit up straight.  Do not exercise if it is too hot, too humid, or if you are in a place of great height (high altitude).  You may continue to have sex unless your doctor tells you not to. Relieving pain and discomfort  Wear a good support bra if your breasts are tender.  Take frequent breaks and rest with your legs raised if you have leg cramps or low back pain.  Take warm water baths (sitz baths) to soothe pain or discomfort caused by hemorrhoids. Use hemorrhoid cream if your doctor approves.  If you develop puffy, bulging veins (varicose  veins) in your legs: ? Wear support hose or compression stockings as told by your doctor. ? Raise (elevate) your feet for 15 minutes, 3-4 times a day. ? Limit salt in your food. Safety  Wear your seat belt when driving.  Make a list of emergency phone numbers, including numbers for family, friends, the hospital, and police and fire departments. Preparing for your baby's arrival To prepare for the arrival of your baby:  Take prenatal classes.  Practice driving to the hospital.  Visit the hospital and tour the maternity area.  Talk to your work about taking leave once the baby comes.  Pack your hospital bag.  Prepare the baby's room.  Go to your doctor visits.  Buy a rear-facing car seat. Learn how to install it in your car. General instructions  Do not use hot tubs, steam rooms, or saunas.  Do not use any products that contain nicotine or tobacco, such as cigarettes and e-cigarettes. If you need help quitting, ask your doctor.  Do not drink alcohol.  Do not douche or use tampons or scented sanitary pads.  Do not cross your legs for long periods of time.  Do not travel for long distances unless you must. Only do so if your doctor says it is okay.  Visit your dentist if you have not gone during your pregnancy. Use a soft toothbrush to brush your teeth. Be gentle when you floss.  Avoid cat litter boxes and soil   used by cats. These carry germs that can cause birth defects in the baby and can cause a loss of your baby (miscarriage) or stillbirth.  Keep all your prenatal visits as told by your doctor. This is important. Contact a doctor if:  You are not sure if you are in labor or if your water has broken.  You are dizzy.  You have mild cramps or pressure in your lower belly.  You have a nagging pain in your belly area.  You continue to feel sick to your stomach, you throw up, or you have watery poop.  You have bad smelling fluid coming from your vagina.  You have  pain when you pee. Get help right away if:  You have a fever.  You are leaking fluid from your vagina.  You are spotting or bleeding from your vagina.  You have severe belly cramps or pain.  You lose or gain weight quickly.  You have trouble catching your breath and have chest pain.  You notice sudden or extreme puffiness (swelling) of your face, hands, ankles, feet, or legs.  You have not felt the baby move in over an hour.  You have severe headaches that do not go away with medicine.  You have trouble seeing.  You are leaking, or you are having a gush of fluid, from your vagina before you are 37 weeks.  You have regular belly spasms (contractions) before you are 37 weeks. Summary  The third trimester is from week 28 through week 40 (months 7 through 9). This time is when your unborn baby is growing very fast.  Follow your doctor's advice about medicine, food, and activity.  Get ready for the arrival of your baby by taking prenatal classes, getting all the baby items ready, preparing the baby's room, and visiting your doctor to be checked.  Get help right away if you are bleeding from your vagina, or you have chest pain and trouble catching your breath, or if you have not felt your baby move in over an hour. This information is not intended to replace advice given to you by your health care provider. Make sure you discuss any questions you have with your health care provider. Document Revised: 07/29/2018 Document Reviewed: 05/13/2016 Elsevier Patient Education  2020 [employer]. How a Baby Grows During Pregnancy  Pregnancy begins when a female's sperm enters a female's egg (fertilization). Fertilization usually happens in one of the tubes (fallopian tubes) that connect the ovaries to the womb (uterus). The fertilized egg moves down the fallopian tube to the uterus. Once it reaches the uterus, it implants into the lining of the uterus and begins to grow. For the first 10  weeks, the fertilized egg is called an embryo. After 10 weeks, it is called a fetus. As the fetus continues to grow, it receives oxygen and nutrients through tissue (placenta) that grows to support the developing baby. The placenta is the life support system for the baby. It provides oxygen and nutrition and removes waste. Learning as much as you can about your pregnancy and how your baby is developing can help you enjoy the experience. It can also make you aware of when there might be a problem and when to ask questions. How long does a typical pregnancy last? A pregnancy usually lasts 280 days, or about 40 weeks. Pregnancy is divided into three periods of growth, also called trimesters:  First trimester: 0-12 weeks.  Second trimester: 13-27 weeks.  Third trimester: 28-40 weeks. The  day when your baby is ready to be born (full term) is your estimated date of delivery. How does my baby develop month by month? First month  The fertilized egg attaches to the inside of the uterus.  Some cells will form the placenta. Others will form the fetus.  The arms, legs, brain, spinal cord, lungs, and heart begin to develop.  At the end of the first month, the heart begins to beat. Second month  The bones, inner ear, eyelids, hands, and feet form.  The genitals develop.  By the end of 8 weeks, all major organs are developing. Third month  All of the internal organs are forming.  Teeth develop below the gums.  Bones and muscles begin to grow. The spine can flex.  The skin is transparent.  Fingernails and toenails begin to form.  Arms and legs continue to grow longer, and hands and feet develop.  The fetus is about 3 inches (7.6 cm) long. Fourth month  The placenta is completely formed.  The external sex organs, neck, outer ear, eyebrows, eyelids, and fingernails are formed.  The fetus can hear, swallow, and move its arms and legs.  The kidneys begin to produce urine.  The skin  is covered with a white, waxy coating (vernix) and very fine hair (lanugo). Fifth month  The fetus moves around more and can be felt for the first time (quickening).  The fetus starts to sleep and wake up and may begin to suck its finger.  The nails grow to the end of the fingers.  The organ in the digestive system that makes bile (gallbladder) functions and helps to digest nutrients.  If your baby is a girl, eggs are present in her ovaries. If your baby is a boy, testicles start to move down into his scrotum. Sixth month  The lungs are formed.  The eyes open. The brain continues to develop.  Your baby has fingerprints and toe prints. Your baby's hair grows thicker.  At the end of the second trimester, the fetus is about 9 inches (22.9 cm) long. Seventh month  The fetus kicks and stretches.  The eyes are developed enough to sense changes in light.  The hands can make a grasping motion.  The fetus responds to sound. Eighth month  All organs and body systems are fully developed and functioning.  Bones harden, and taste buds develop. The fetus may hiccup.  Certain areas of the brain are still developing. The skull remains soft. Ninth month  The fetus gains about  lb (0.23 kg) each week.  The lungs are fully developed.  Patterns of sleep develop.  The fetus's head typically moves into a head-down position (vertex) in the uterus to prepare for birth.  The fetus weighs 6-9 lb (2.72-4.08 kg) and is 19-20 inches (48.26-50.8 cm) long. What can I do to have a healthy pregnancy and help my baby develop? General instructions  Take prenatal vitamins as directed by your health care provider. These include vitamins such as folic acid, iron, calcium, and vitamin D. They are important for healthy development.  Take medicines only as directed by your health care provider. Read labels and ask a pharmacist or your health care provider whether over-the-counter medicines, supplements,  and prescription drugs are safe to take during pregnancy.  Keep all follow-up visits as directed by your health care provider. This is important. Follow-up visits include prenatal care and screening tests. How do I know if my baby is developing well? At each  top of the uterus using a tape measure. Your health care provider will also feel your belly to determine your baby's position. Heartbeat. An ultrasound in the first trimester can confirm pregnancy and show a heartbeat, depending on how far along you are. Your health care provider will check your baby's heart rate at every prenatal visit. Second trimester ultrasound. This ultrasound checks your baby's development. It also may show your baby's gender. What should I do if I have concerns about my baby's development? Always talk with your health care provider about any concerns that you may have about your pregnancy and your baby. Summary A pregnancy usually lasts 280 days, or about 40 weeks. Pregnancy is divided into three periods of growth, also called trimesters. Your health care provider will monitor your baby's growth and development throughout your pregnancy. Follow your health care provider's recommendations about taking prenatal vitamins and medicines during your pregnancy. Talk with your health care provider if you have any concerns about your pregnancy or your developing baby. This information is not intended to replace advice given to you by your health care provider. Make sure you discuss any questions you have with your health care provider. Document Revised: 07/29/2018 Document Reviewed: 02/18/2017 Elsevier Patient Education  2020 Elsevier Inc[employer]   

## 2019-12-21 LAB — 28 WEEK RH+PANEL
Basophils Absolute: 0 10*3/uL (ref 0.0–0.2)
Basos: 0 %
EOS (ABSOLUTE): 0.1 10*3/uL (ref 0.0–0.4)
Eos: 1 %
Gestational Diabetes Screen: 70 mg/dL (ref 65–139)
HIV Screen 4th Generation wRfx: NONREACTIVE
Hematocrit: 33.2 % — ABNORMAL LOW (ref 34.0–46.6)
Hemoglobin: 10.6 g/dL — ABNORMAL LOW (ref 11.1–15.9)
Immature Grans (Abs): 0 10*3/uL (ref 0.0–0.1)
Immature Granulocytes: 0 %
Lymphocytes Absolute: 2.2 10*3/uL (ref 0.7–3.1)
Lymphs: 26 %
MCH: 30.1 pg (ref 26.6–33.0)
MCHC: 31.9 g/dL (ref 31.5–35.7)
MCV: 94 fL (ref 79–97)
Monocytes Absolute: 0.5 10*3/uL (ref 0.1–0.9)
Monocytes: 6 %
Neutrophils Absolute: 5.4 10*3/uL (ref 1.4–7.0)
Neutrophils: 67 %
Platelets: 186 10*3/uL (ref 150–450)
RBC: 3.52 x10E6/uL — ABNORMAL LOW (ref 3.77–5.28)
RDW: 13.7 % (ref 11.7–15.4)
RPR Ser Ql: NONREACTIVE
WBC: 8.2 10*3/uL (ref 3.4–10.8)

## 2020-01-03 ENCOUNTER — Ambulatory Visit (INDEPENDENT_AMBULATORY_CARE_PROVIDER_SITE_OTHER): Payer: Medicaid Other | Admitting: Obstetrics & Gynecology

## 2020-01-03 ENCOUNTER — Other Ambulatory Visit: Payer: Self-pay

## 2020-01-03 ENCOUNTER — Encounter: Payer: Self-pay | Admitting: Obstetrics & Gynecology

## 2020-01-03 VITALS — BP 118/80 | Wt 204.0 lb

## 2020-01-03 DIAGNOSIS — Z23 Encounter for immunization: Secondary | ICD-10-CM | POA: Diagnosis not present

## 2020-01-03 DIAGNOSIS — Z3A29 29 weeks gestation of pregnancy: Secondary | ICD-10-CM

## 2020-01-03 DIAGNOSIS — Z3403 Encounter for supervision of normal first pregnancy, third trimester: Secondary | ICD-10-CM

## 2020-01-03 NOTE — Addendum Note (Signed)
Addended by: Cornelius Moras D on: 01/03/2020 10:56 AM   Modules accepted: Orders

## 2020-01-03 NOTE — Progress Notes (Signed)
°  Subjective  Fetal Movement? yes Contractions? no Leaking Fluid? no Vaginal Bleeding? no  Objective  BP 118/80    Wt 204 lb (92.5 kg)    LMP 06/06/2019 (Approximate)    BMI 36.14 kg/m  General: NAD Pumonary: no increased work of breathing Abdomen: gravid, non-tender Extremities: no edema Psychiatric: mood appropriate, affect full  Assessment  19 y.o. G1P0000 at [redacted]w[redacted]d by  03/18/2020, by Ultrasound presenting for routine prenatal visit  Plan   Problem List Items Addressed This Visit      Other   Encounter for supervision of normal first pregnancy in first trimester    Other Visit Diagnoses    [redacted] weeks gestation of pregnancy    -  Primary    PNV, [facility] TDaP today Declines flu shot   Problem Noted Resolved   HSV (herpes simplex virus) anogenital infection 10/10/2019 by Tresea Mall, CNM No   Encounter for supervision of normal first pregnancy in first trimester 08/03/2019 by Tresea Mall, CNM No   Overview Addendum 12/20/2019  9:32 AM by Mirna Mires, CNM    Clinic [facility] Prenatal Labs  Dating  9 wk [place] Blood type: O/Positive/-- (05/11 1023)   Genetic Screen  NIPS: normal XY Antibody:Negative (05/11 1023)  Anatomic [place] [facility] nml Rubella: 2.44 (05/11 1023)  Varicella: Immune  GTT 8/31 RPR: Non Reactive (05/11 1023)   Rhogam Not needed HBsAg: Negative (05/11 1023)   Vaccines TDAP:       today                Flu Shot: declines HIV: Non Reactive (05/11 1023)   Baby Food Breast                               GBS: p  Contraception  Pap: < 21 y.o.  CBB  No  Valtrex @ 36 weeks [ ]    CS/VBAC NA HgbE: normal  Support Person , MD, Hulan Saas Ob/Gyn, [facility] 01/03/2020  10:50 AM

## 2020-01-03 NOTE — Patient Instructions (Signed)

## 2020-01-17 ENCOUNTER — Encounter: Payer: Self-pay | Admitting: Obstetrics & Gynecology

## 2020-01-17 ENCOUNTER — Ambulatory Visit (INDEPENDENT_AMBULATORY_CARE_PROVIDER_SITE_OTHER): Payer: Medicaid Other | Admitting: Obstetrics & Gynecology

## 2020-01-17 ENCOUNTER — Other Ambulatory Visit: Payer: Self-pay

## 2020-01-17 VITALS — BP 128/80 | Wt 208.0 lb

## 2020-01-17 DIAGNOSIS — O99513 Diseases of the respiratory system complicating pregnancy, third trimester: Secondary | ICD-10-CM

## 2020-01-17 DIAGNOSIS — J45909 Unspecified asthma, uncomplicated: Secondary | ICD-10-CM

## 2020-01-17 DIAGNOSIS — Z3403 Encounter for supervision of normal first pregnancy, third trimester: Secondary | ICD-10-CM

## 2020-01-17 DIAGNOSIS — Z3A31 31 weeks gestation of pregnancy: Secondary | ICD-10-CM

## 2020-01-17 NOTE — Progress Notes (Signed)
Prenatal Visit Note Date: 01/17/2020 Clinic: [facility]  Subjective:  Sarah Oneill is a 19 y.o. G1P0000 at [redacted]w[redacted]d being seen today for ongoing prenatal care.  She is currently monitored for the following issues for this high-risk pregnancy and has Anxiety state; ADHD (attention deficit hyperactivity disorder); Astigmatism; Bee sting allergy; BMI (body mass index), pediatric, greater than or equal to 95% for age; Chronic constipation; Insulin resistance; Recurrent UTI; Shellfish allergy; Encounter for supervision of normal first pregnancy in third trimester; and HSV (herpes simplex virus) anogenital infection on their problem list.    Also has ASTHMA, and reports worsening SOB w exertion to the point of feeling like syncope.  She works and desires to work, but fears passing out or feeling so winded and out of breath.  Patient reports no pregnancy ctxs pains, nausea, edema.   Contractions: Not present. Vag. Bleeding: None.  Movement: Present. Denies leaking of fluid.   The following portions of the patient's history were reviewed and updated as appropriate: allergies, current medications, past family history, past medical history, past social history, past surgical history and problem list. Problem list updated.  Objective:   Vitals:   01/17/20 1006  BP: 128/80  Weight: 208 lb (94.3 kg)    Fetal Status:     Movement: Present     General:  Alert, oriented and cooperative. Patient is in no acute distress.  Skin: Skin is warm and dry. No rash noted.   Cardiovascular: Normal heart rate noted  Respiratory: Normal respiratory effort, no problems with respiration noted, No Wheezing noted  Abdomen: Soft, gravid, appropriate for gestational age. Pain/Pressure: Absent     Pelvic:  Cervical exam deferred        Extremities: Normal range of motion.     Mental Status: Normal mood and affect. Normal behavior. Normal judgment and thought content.    Assessment and Plan:  Pregnancy: G1P0000 at  [redacted]w[redacted]d  1. [redacted] weeks gestation of pregnancy PNV, [facility]  2. Encounter for supervision of normal first pregnancy in third trimester  3. Asthma affecting pregnancy in third trimester - Out of work 7 days to see if helps, also to give time for pulm referral (urgent ordered) - Ambulatory referral to Pulmonology  Preterm labor symptoms and general obstetric precautions including but not limited to vaginal bleeding, contractions, leaking of fluid and fetal movement were reviewed in detail with the patient. Please refer to After Visit Summary for other counseling recommendations.   Pregnancy #1 Problems (from 08/02/19 to present)    Problem Noted Resolved   HSV (herpes simplex virus) anogenital infection 10/10/2019 by Tresea Mall, CNM No   Encounter for supervision of normal first pregnancy in third trimester 08/03/2019 by Tresea Mall, CNM No   Overview Addendum 01/03/2020 10:52 AM by Nadara Mustard, MD    Clinic [facility] Prenatal Labs  Dating  9 wk [place] Blood type: O/Positive/-- (05/11 1023)   Genetic Screen  NIPS: normal XY Antibody:Negative (05/11 1023)  Anatomic [place] [facility] nml Rubella: 2.44 (05/11 1023)  Varicella: Immune  GTT 8/31 RPR: Non Reactive (05/11 1023)   Rhogam Not needed HBsAg: Negative (05/11 1023)   Vaccines TDAP:  9/14                  Flu Shot:declines HIV: Non Reactive (05/11 1023)   Baby Food Breast  GBS: p  Contraception  Pap: < 21 y.o.  CBB  No  Valtrex @ 36 weeks [ ]    CS/VBAC NA HgbE: normal  Support Person            Return in about 2 weeks (around 01/31/2020) for ROB and 4 weeks ROB.  04/01/2020, MD, Annamarie Major Ob/Gyn, [facility] 01/17/2020  10:48 AM

## 2020-01-17 NOTE — Patient Instructions (Signed)
Third Trimester of Pregnancy The third trimester is from week 28 through week 40 (months 7 through 9). The third trimester is a time when the unborn baby (fetus) is growing rapidly. At the end of the ninth month, the fetus is about 20 inches in length and weighs 6-10 pounds. Body changes during your third trimester Your body will continue to go through many changes during pregnancy. The changes vary from woman to woman. During the third trimester:  Your weight will continue to increase. You can expect to gain 25-35 pounds (11-16 kg) by the end of the pregnancy.  You may begin to get stretch marks on your hips, abdomen, and breasts.  You may urinate more often because the fetus is moving lower into your pelvis and pressing on your bladder.  You may develop or continue to have heartburn. This is caused by increased hormones that slow down muscles in the digestive tract.  You may develop or continue to have constipation because increased hormones slow digestion and cause the muscles that push waste through your intestines to relax.  You may develop hemorrhoids. These are swollen veins (varicose veins) in the rectum that can itch or be painful.  You may develop swollen, bulging veins (varicose veins) in your legs.  You may have increased body aches in the pelvis, back, or thighs. This is due to weight gain and increased hormones that are relaxing your joints.  You may have changes in your hair. These can include thickening of your hair, rapid growth, and changes in texture. Some women also have hair loss during or after pregnancy, or hair that feels dry or thin. Your hair will most likely return to normal after your baby is born.  Your breasts will continue to grow and they will continue to become tender. A yellow fluid (colostrum) may leak from your breasts. This is the first milk you are producing for your baby.  Your belly button may stick out.  You may notice more swelling in your hands,  face, or ankles.  You may have increased tingling or numbness in your hands, arms, and legs. The skin on your belly may also feel numb.  You may feel short of breath because of your expanding uterus.  You may have more problems sleeping. This can be caused by the size of your belly, increased need to urinate, and an increase in your body's metabolism.  You may notice the fetus "dropping," or moving lower in your abdomen (lightening).  You may have increased vaginal discharge.  You may notice your joints feel loose and you may have pain around your pelvic bone. What to expect at prenatal visits You will have prenatal exams every 2 weeks until week 36. Then you will have weekly prenatal exams. During a routine prenatal visit:  You will be weighed to make sure you and the baby are growing normally.  Your blood pressure will be taken.  Your abdomen will be measured to track your baby's growth.  The fetal heartbeat will be listened to.  Any test results from the previous visit will be discussed.  You may have a cervical check near your due date to see if your cervix has softened or thinned (effaced).  You will be tested for Group B streptococcus. This happens between 35 and 37 weeks. Your health care provider may ask you:  What your birth plan is.  How you are feeling.  If you are feeling the baby move.  If you have had any abnormal   symptoms, such as leaking fluid, bleeding, severe headaches, or abdominal cramping.  If you are using any tobacco products, including cigarettes, chewing tobacco, and electronic cigarettes.  If you have any questions. Other tests or screenings that may be performed during your third trimester include:  Blood tests that check for low iron levels (anemia).  Fetal testing to check the health, activity level, and growth of the fetus. Testing is done if you have certain medical conditions or if there are problems during the pregnancy.  Nonstress test  (NST). This test checks the health of your baby to make sure there are no signs of problems, such as the baby not getting enough oxygen. During this test, a belt is placed around your belly. The baby is made to move, and its heart rate is monitored during movement. What is false labor? False labor is a condition in which you feel small, irregular tightenings of the muscles in the womb (contractions) that usually go away with rest, changing position, or drinking water. These are called Braxton Hicks contractions. Contractions may last for hours, days, or even weeks before true labor sets in. If contractions come at regular intervals, become more frequent, increase in intensity, or become painful, you should see your health care provider. What are the signs of labor?  Abdominal cramps.  Regular contractions that start at 10 minutes apart and become stronger and more frequent with time.  Contractions that start on the top of the uterus and spread down to the lower abdomen and back.  Increased pelvic pressure and dull back pain.  A watery or bloody mucus discharge that comes from the vagina.  Leaking of amniotic fluid. This is also known as your "water breaking." It could be a slow trickle or a gush. Let your health care provider know if it has a color or strange odor. If you have any of these signs, call your health care provider right away, even if it is before your due date. Follow these instructions at home: Medicines  Follow your health care provider's instructions regarding medicine use. Specific medicines may be either safe or unsafe to take during pregnancy.  Take a prenatal vitamin that contains at least 600 micrograms (mcg) of folic acid.  If you develop constipation, try taking a stool softener if your health care provider approves. Eating and drinking   Eat a balanced diet that includes fresh fruits and vegetables, whole grains, good sources of protein such as meat, eggs, or tofu,  and low-fat dairy. Your health care provider will help you determine the amount of weight gain that is right for you.  Avoid raw meat and uncooked cheese. These carry germs that can cause birth defects in the baby.  If you have low calcium intake from food, talk to your health care provider about whether you should take a daily calcium supplement.  Eat four or five small meals rather than three large meals a day.  Limit foods that are high in fat and processed sugars, such as fried and sweet foods.  To prevent constipation: ? Drink enough fluid to keep your urine clear or pale yellow. ? Eat foods that are high in fiber, such as fresh fruits and vegetables, whole grains, and beans. Activity  Exercise only as directed by your health care provider. Most women can continue their usual exercise routine during pregnancy. Try to exercise for 30 minutes at least 5 days a week. Stop exercising if you experience uterine contractions.  Avoid heavy lifting.  Do   not exercise in extreme heat or humidity, or at high altitudes.  Wear low-heel, comfortable shoes.  Practice good posture.  You may continue to have sex unless your health care provider tells you otherwise. Relieving pain and discomfort  Take frequent breaks and rest with your legs elevated if you have leg cramps or low back pain.  Take warm sitz baths to soothe any pain or discomfort caused by hemorrhoids. Use hemorrhoid cream if your health care provider approves.  Wear a good support bra to prevent discomfort from breast tenderness.  If you develop varicose veins: ? Wear support pantyhose or compression stockings as told by your healthcare provider. ? Elevate your feet for 15 minutes, 3-4 times a day. Prenatal care  Write down your questions. Take them to your prenatal visits.  Keep all your prenatal visits as told by your health care provider. This is important. Safety  Wear your seat belt at all times when driving.  Make  a list of emergency phone numbers, including numbers for family, friends, the hospital, and police and fire departments. General instructions  Avoid cat litter boxes and soil used by cats. These carry germs that can cause birth defects in the baby. If you have a cat, ask someone to clean the litter box for you.  Do not travel far distances unless it is absolutely necessary and only with the approval of your health care provider.  Do not use hot tubs, steam rooms, or saunas.  Do not drink alcohol.  Do not use any products that contain nicotine or tobacco, such as cigarettes and e-cigarettes. If you need help quitting, ask your health care provider.  Do not use any medicinal herbs or unprescribed drugs. These chemicals affect the formation and growth of the baby.  Do not douche or use tampons or scented sanitary pads.  Do not cross your legs for long periods of time.  To prepare for the arrival of your baby: ? Take prenatal classes to understand, practice, and ask questions about labor and delivery. ? Make a trial run to the hospital. ? Visit the hospital and tour the maternity area. ? Arrange for maternity or paternity leave through employers. ? Arrange for family and friends to take care of pets while you are in the hospital. ? Purchase a rear-facing car seat and make sure you know how to install it in your car. ? Pack your hospital bag. ? Prepare the baby's nursery. Make sure to remove all pillows and stuffed animals from the baby's crib to prevent suffocation.  Visit your dentist if you have not gone during your pregnancy. Use a soft toothbrush to brush your teeth and be gentle when you floss. Contact a health care provider if:  You are unsure if you are in labor or if your water has broken.  You become dizzy.  You have mild pelvic cramps, pelvic pressure, or nagging pain in your abdominal area.  You have lower back pain.  You have persistent nausea, vomiting, or  diarrhea.  You have an unusual or bad smelling vaginal discharge.  You have pain when you urinate. Get help right away if:  Your water breaks before 37 weeks.  You have regular contractions less than 5 minutes apart before 37 weeks.  You have a fever.  You are leaking fluid from your vagina.  You have spotting or bleeding from your vagina.  You have severe abdominal pain or cramping.  You have rapid weight loss or weight gain.  You have   shortness of breath with chest pain.  You notice sudden or extreme swelling of your face, hands, ankles, feet, or legs.  Your baby makes fewer than 10 movements in 2 hours.  You have severe headaches that do not go away when you take medicine.  You have vision changes. Summary  The third trimester is from week 28 through week 40, months 7 through 9. The third trimester is a time when the unborn baby (fetus) is growing rapidly.  During the third trimester, your discomfort may increase as you and your baby continue to gain weight. You may have abdominal, leg, and back pain, sleeping problems, and an increased need to urinate.  During the third trimester your breasts will keep growing and they will continue to become tender. A yellow fluid (colostrum) may leak from your breasts. This is the first milk you are producing for your baby.  False labor is a condition in which you feel small, irregular tightenings of the muscles in the womb (contractions) that eventually go away. These are called Braxton Hicks contractions. Contractions may last for hours, days, or even weeks before true labor sets in.  Signs of labor can include: abdominal cramps; regular contractions that start at 10 minutes apart and become stronger and more frequent with time; watery or bloody mucus discharge that comes from the vagina; increased pelvic pressure and dull back pain; and leaking of amniotic fluid. This information is not intended to replace advice given to you by your  health care provider. Make sure you discuss any questions you have with your health care provider. Document Revised: 07/29/2018 Document Reviewed: 05/13/2016 Elsevier Patient Education  2020 [employer]  

## 2020-01-19 ENCOUNTER — Ambulatory Visit (INDEPENDENT_AMBULATORY_CARE_PROVIDER_SITE_OTHER): Payer: Medicaid Other | Admitting: Internal Medicine

## 2020-01-19 ENCOUNTER — Other Ambulatory Visit: Payer: Self-pay

## 2020-01-19 ENCOUNTER — Encounter: Payer: Self-pay | Admitting: Internal Medicine

## 2020-01-19 VITALS — BP 112/82 | HR 103 | Temp 97.3°F | Ht 63.0 in | Wt 211.0 lb

## 2020-01-19 DIAGNOSIS — Z3A31 31 weeks gestation of pregnancy: Secondary | ICD-10-CM

## 2020-01-19 DIAGNOSIS — J4520 Mild intermittent asthma, uncomplicated: Secondary | ICD-10-CM | POA: Diagnosis not present

## 2020-01-19 NOTE — Patient Instructions (Addendum)
USE ALBUTEROL AS NEEDED RECOMMEND DECREASE WORK LOAD TAKE IT EASY!!   CONGARTUALTIONS!!!!! TEAM [place]!!!

## 2020-01-19 NOTE — Progress Notes (Signed)
Name: Sarah Oneill MRN: [id number] DOB: 2000-09-05     CONSULTATION DATE: 01/19/2020  REFERRING MD : Tiburcio Pea  CHIEF COMPLAINT: ASTHMA   HISTORY OF PRESENT ILLNESS: 19 year old pleasant African-American female [redacted] weeks gestational age pregnancy Patient does have a history of asthma as a child Patient use inhalers as needed it seems that most of her asthma was exercise-induced Patient does remember several ER visits as a child She was hospitalized a year ago for her asthma  At this time patient states that she has progressive shortness of breath and dyspnea exertion There is no associated wheezing There is no coughing There is no productive cough  No exacerbation at this time No evidence of heart failure at this time No evidence or signs of infection at this time No respiratory distress No fevers, chills, nausea, vomiting, diarrhea No evidence of lower extremity edema No evidence hemoptysis  This is patient's first pregnancy and does not know what to expect with her asthma Since there are no signs of asthma exacerbation at this time it seems that her progressive dyspnea exertion and shortness of breath is related to her increased weight gain over the last 1 month of 30 pounds including her abdominal distention into the chest wall cavity which restricts her pulmonary physiology.  At this time I have explained to the patient that I do not think her asthma is out of control and that her condition is most likely related to her deconditioned state along with her weight gain and her pregnancy that is causing her to feel more fatigued.   During her visit patient did not have any respiratory symptoms at rest she feels comfortable does not look fatigued      PAST MEDICAL HISTORY :   has a past medical history of Anemia, Asthma, and Vitamin D deficiency (04/19/2014).  has a past surgical history that includes Tonsillectomy and adenoidectomy (N/A, 08/30/2014) and Hernia repair. Prior  to Admission medications   Medication Sig Start Date End Date Taking? Authorizing Provider  albuterol (PROAIR HFA) 108 (90 Base) MCG/ACT inhaler Inhale 2 puffs into the lungs every 4 (four) hours as needed.  08/22/14  Yes [provider]  Prenat w/o A Vit-FeFum-FePo-FA (CONCEPT OB) 130-92.4-1 MG CAPS Take 1 capsule by mouth daily. 08/30/19  Yes Schuman, Christanna R, MD  Prenatal Vit-Fe Fumarate-FA (MULTIVITAMIN-PRENATAL) 27-0.8 MG TABS tablet Take 1 tablet by mouth daily at 12 noon.   Yes [provider]  valACYclovir (VALTREX) 500 MG tablet Take 1 tablet (500 mg total) by mouth 2 (two) times daily. 10/28/19  Yes Tresea Mall, CNM  EPINEPHrine 0.3 mg/0.3 mL IJ SOAJ injection Inject 0.3 mg into the muscle as needed. For Bee stings Patient not taking: Reported on 01/19/2020    [provider]   Allergies  Allergen Reactions  . Augmentin [Amoxicillin-Pot Clavulanate] Hives  . Bee Venom Anaphylaxis    "Uses and Epi Pen for this." Per mother  . Shellfish Allergy Anaphylaxis, Hives and Other (See Comments)    FAMILY HISTORY:  family history includes ADD / ADHD in her mother and sister; Anxiety disorder in her mother; Bipolar disorder in her mother and sister; Depression in her maternal grandmother; Migraines in her father, maternal grandmother, mother, and sister; Seizures in her mother. SOCIAL HISTORY:  reports that she is a non-smoker but has been exposed to tobacco smoke. She has never used smokeless tobacco. She reports that she does not drink alcohol and does not use drugs.    Review  of Systems:  Gen:  Denies  fever, sweats, chills weigh loss  HEENT: Denies blurred vision, double vision, ear pain, eye pain, hearing loss, nose bleeds, sore throat Cardiac:  No dizziness, chest pain or heaviness, chest tightness,edema, No JVD Resp:   No cough, -sputum production, +shortness of breath,-wheezing, -hemoptysis,  Gi: Denies swallowing difficulty, stomach pain, nausea or  vomiting, diarrhea, constipation, bowel incontinence Gu:  Denies bladder incontinence, burning urine Ext:   Denies Joint pain, stiffness or swelling Skin: Denies  skin rash, easy bruising or bleeding or hives Endoc:  Denies polyuria, polydipsia , polyphagia or weight change Psych:   Denies depression, insomnia or hallucinations  Other:  All other systems negative   BP 112/82 (BP Location: Right Arm, Patient Position: Sitting, Cuff Size: Normal)   Pulse (!) 103   Temp (!) 97.3 F (36.3 C) (Temporal)   Ht 5\' 3"  (1.6 m)   Wt 211 lb (95.7 kg)   LMP 06/06/2019 (Approximate)   SpO2 100%   BMI 37.38 kg/m     SpO2: 100 % O2 Device: None (Room air)    Physical Examination:   GENERAL:NAD, no fevers, chills, no weakness no fatigue HEAD: Normocephalic, atraumatic.  EYES: PERLA, EOMI No scleral icterus.  MOUTH: Moist mucosal membrane.  EAR, NOSE, THROAT: Clear without exudates. No external lesions.  NECK: Supple.  PULMONARY: CTA B/L no wheezing, rhonchi, crackles CARDIOVASCULAR: S1 and S2. Regular rate and rhythm. No murmurs GASTROINTESTINAL: Soft, nontender, nondistended. Positive bowel sounds.  MUSCULOSKELETAL: No swelling, clubbing, or edema.  NEUROLOGIC: No gross focal neurological deficits. 5/5 strength all extremities SKIN: No ulceration, lesions, rashes, or cyanosis.  PSYCHIATRIC: Insight, judgment intact. -depression -anxiety ALL OTHER ROS ARE NEGATIVE   MEDICATIONS: I have reviewed all medications and confirmed regimen as documented       ASSESSMENT AND PLAN SYNOPSIS 19 year old pleasant African-American female [redacted] weeks pregnant with a history of asthma  Patient with some shortness of breath and progressive dyspnea exertion most likely related to her underlying pregnancy with deconditioned state and significant weight gain   At this time her asthma does not seem to be a major factor and is not causing any wheezing or productive cough and there is no signs of  infection at this time.  At this time I recommend using albuterol as needed ONLY I did talk to the patient about starting inhaled steroids and long-acting beta agonist however in the setting of pregnancy she does NOT want to take any extra medications at this time.  No indication for prednisone at this time no indication for antibiotics at this time  As this is the patient's first pregnancy she does not know what to expect with her respiratory status and her asthma.  I I have explained to her that if her symptoms get worse and she starts wheezing and there is a productive cough and symptoms get worse to give 28 weeks a call back we will add inhaler therapy and may even consider prednisone therapy  Patient is very satisfied with today's visit and the established plan of care  I have strongly advised that patient decrease her workload In the past 7 days she is work 7 days straight this is putting a strain on her body and her baby I strongly advised that she quit her job or find a solution to where she is not constantly being asked to do things and walk around.  Her symptoms were excessive by overwork and exhaustion.   COVID-19 EDUCATION: The signs and symptoms  of COVID-19 were discussed with the patient and how to seek care for testing.  The importance of social distancing was discussed today. Hand Washing Techniques and avoid touching face was advised.   MEDICATION ADJUSTMENTS/LABS AND TESTS ORDERED: Albuterol as needed Avoid triggers   CURRENT MEDICATIONS REVIEWED AT LENGTH WITH PATIENT TODAY   Patient satisfied with Plan of action and management. All questions answered  Follow up in 3 months  TOTAL TIME SPENT 62 minutes   Lucie Leather, M.D.  [facility]  Medical Director [facility] Medical Director [facility] Cardio-Pulmonary Department

## 2020-01-31 ENCOUNTER — Encounter: Payer: Self-pay | Admitting: Obstetrics & Gynecology

## 2020-01-31 ENCOUNTER — Ambulatory Visit (INDEPENDENT_AMBULATORY_CARE_PROVIDER_SITE_OTHER): Payer: Medicaid Other | Admitting: Obstetrics & Gynecology

## 2020-01-31 ENCOUNTER — Other Ambulatory Visit: Payer: Self-pay

## 2020-01-31 VITALS — BP 124/66 | Wt 210.0 lb

## 2020-01-31 DIAGNOSIS — Z3A33 33 weeks gestation of pregnancy: Secondary | ICD-10-CM

## 2020-01-31 DIAGNOSIS — J45909 Unspecified asthma, uncomplicated: Secondary | ICD-10-CM

## 2020-01-31 DIAGNOSIS — O99513 Diseases of the respiratory system complicating pregnancy, third trimester: Secondary | ICD-10-CM

## 2020-01-31 DIAGNOSIS — Z3403 Encounter for supervision of normal first pregnancy, third trimester: Secondary | ICD-10-CM

## 2020-01-31 NOTE — Patient Instructions (Signed)

## 2020-01-31 NOTE — Progress Notes (Signed)
  Subjective  Fetal Movement? yes Contractions? no Leaking Fluid? no Vaginal Bleeding? no Breathing better now that she is working less    No s/sx asthma, no wheezing    See consult from pulm Dr Belia Heman Objective  BP 124/66   Wt 210 lb (95.3 kg)   LMP 06/06/2019 (Approximate)   BMI 37.20 kg/m  General: NAD Pumonary: no increased work of breathing Abdomen: gravid, non-tender Extremities: no edema Psychiatric: mood appropriate, affect full  Assessment  19 y.o. G1P0000 at [redacted]w[redacted]d by  03/18/2020, by Ultrasound presenting for routine prenatal visit  Plan   Problem List Items Addressed This Visit      Other   Encounter for supervision of normal first pregnancy in third trimester    Other Visit Diagnoses    [redacted] weeks gestation of pregnancy    -  Primary   Asthma affecting pregnancy in third trimester          Pregnancy #1 Problems (from 08/02/19 to present)    Problem Noted Resolved   HSV (herpes simplex virus) anogenital infection 10/10/2019 by Tresea Mall, CNM No   Encounter for supervision of normal first pregnancy in third trimester 08/03/2019 by Tresea Mall, CNM No   Overview Addendum 01/03/2020 10:52 AM by Nadara Mustard, MD    Clinic [facility] Prenatal Labs  Dating  9 wk [place] Blood type: O/Positive/-- (05/11 1023)   Genetic Screen  NIPS: normal XY Antibody:Negative (05/11 1023)  Anatomic [place] [facility] nml Rubella: 2.44 (05/11 1023)  Varicella: Immune  GTT 8/31 RPR: Non Reactive (05/11 1023)   Rhogam Not needed HBsAg: Negative (05/11 1023)   Vaccines TDAP:  9/14                  Flu Shot:declines HIV: Non Reactive (05/11 1023)   Baby Food Breast                               GBS:   Contraception  Pap: < 21 y.o.  CBB  No  Valtrex @ 36 weeks [ ]    CS/VBAC NA HgbE: normal  Support Person [place]          Previous Version       [place], MD, Annam[facility] Major [facility], [facility] 01/31/2020  10:26 AM

## 2020-02-04 IMAGING — CR CHEST - 2 VIEW
2 series · 2 of 2 positions shown · non-contrast
Comparison: 05/21/2005

CLINICAL DATA: Dizziness for 2 days

EXAM:
CHEST - 2 VIEW

[chest pa]
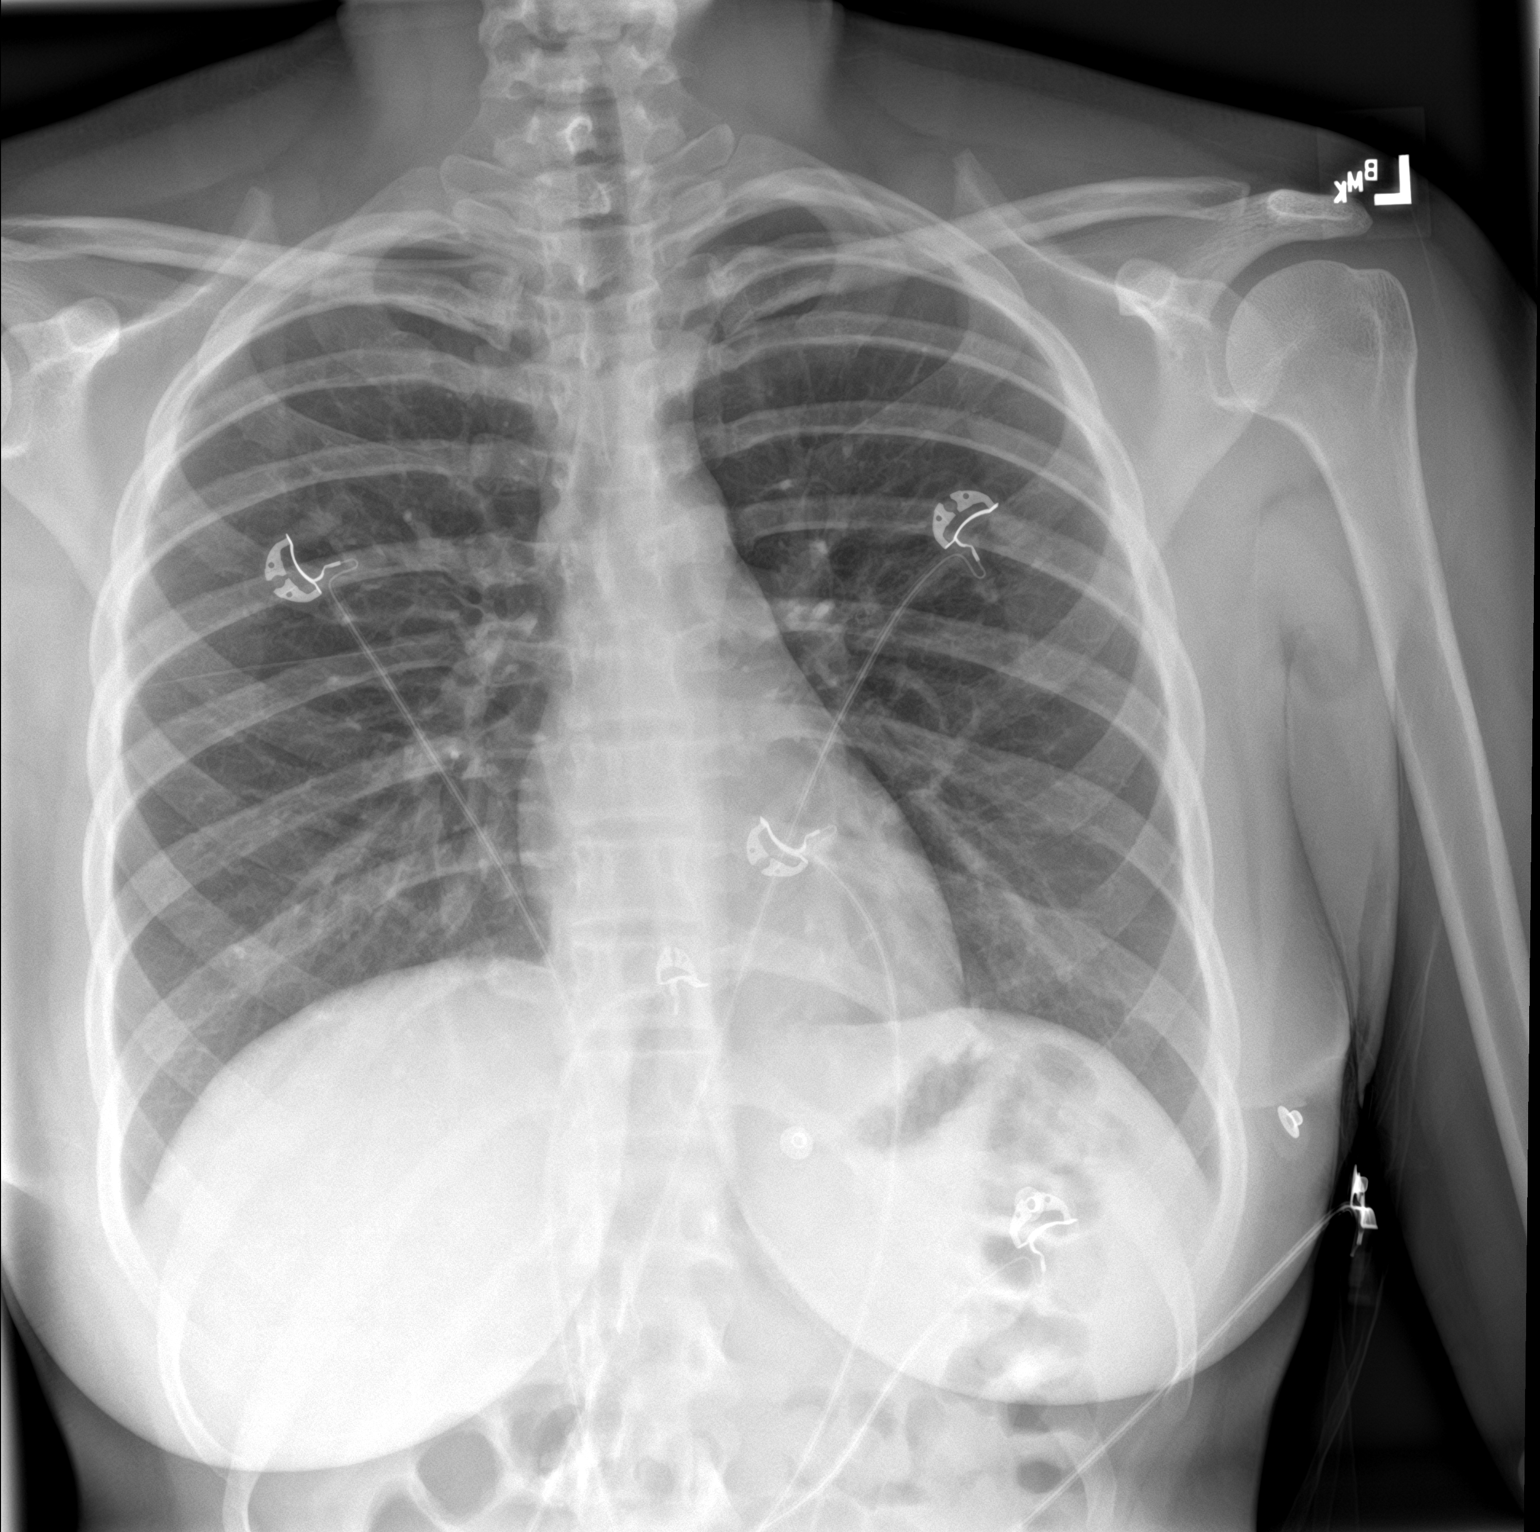

[chest lat]
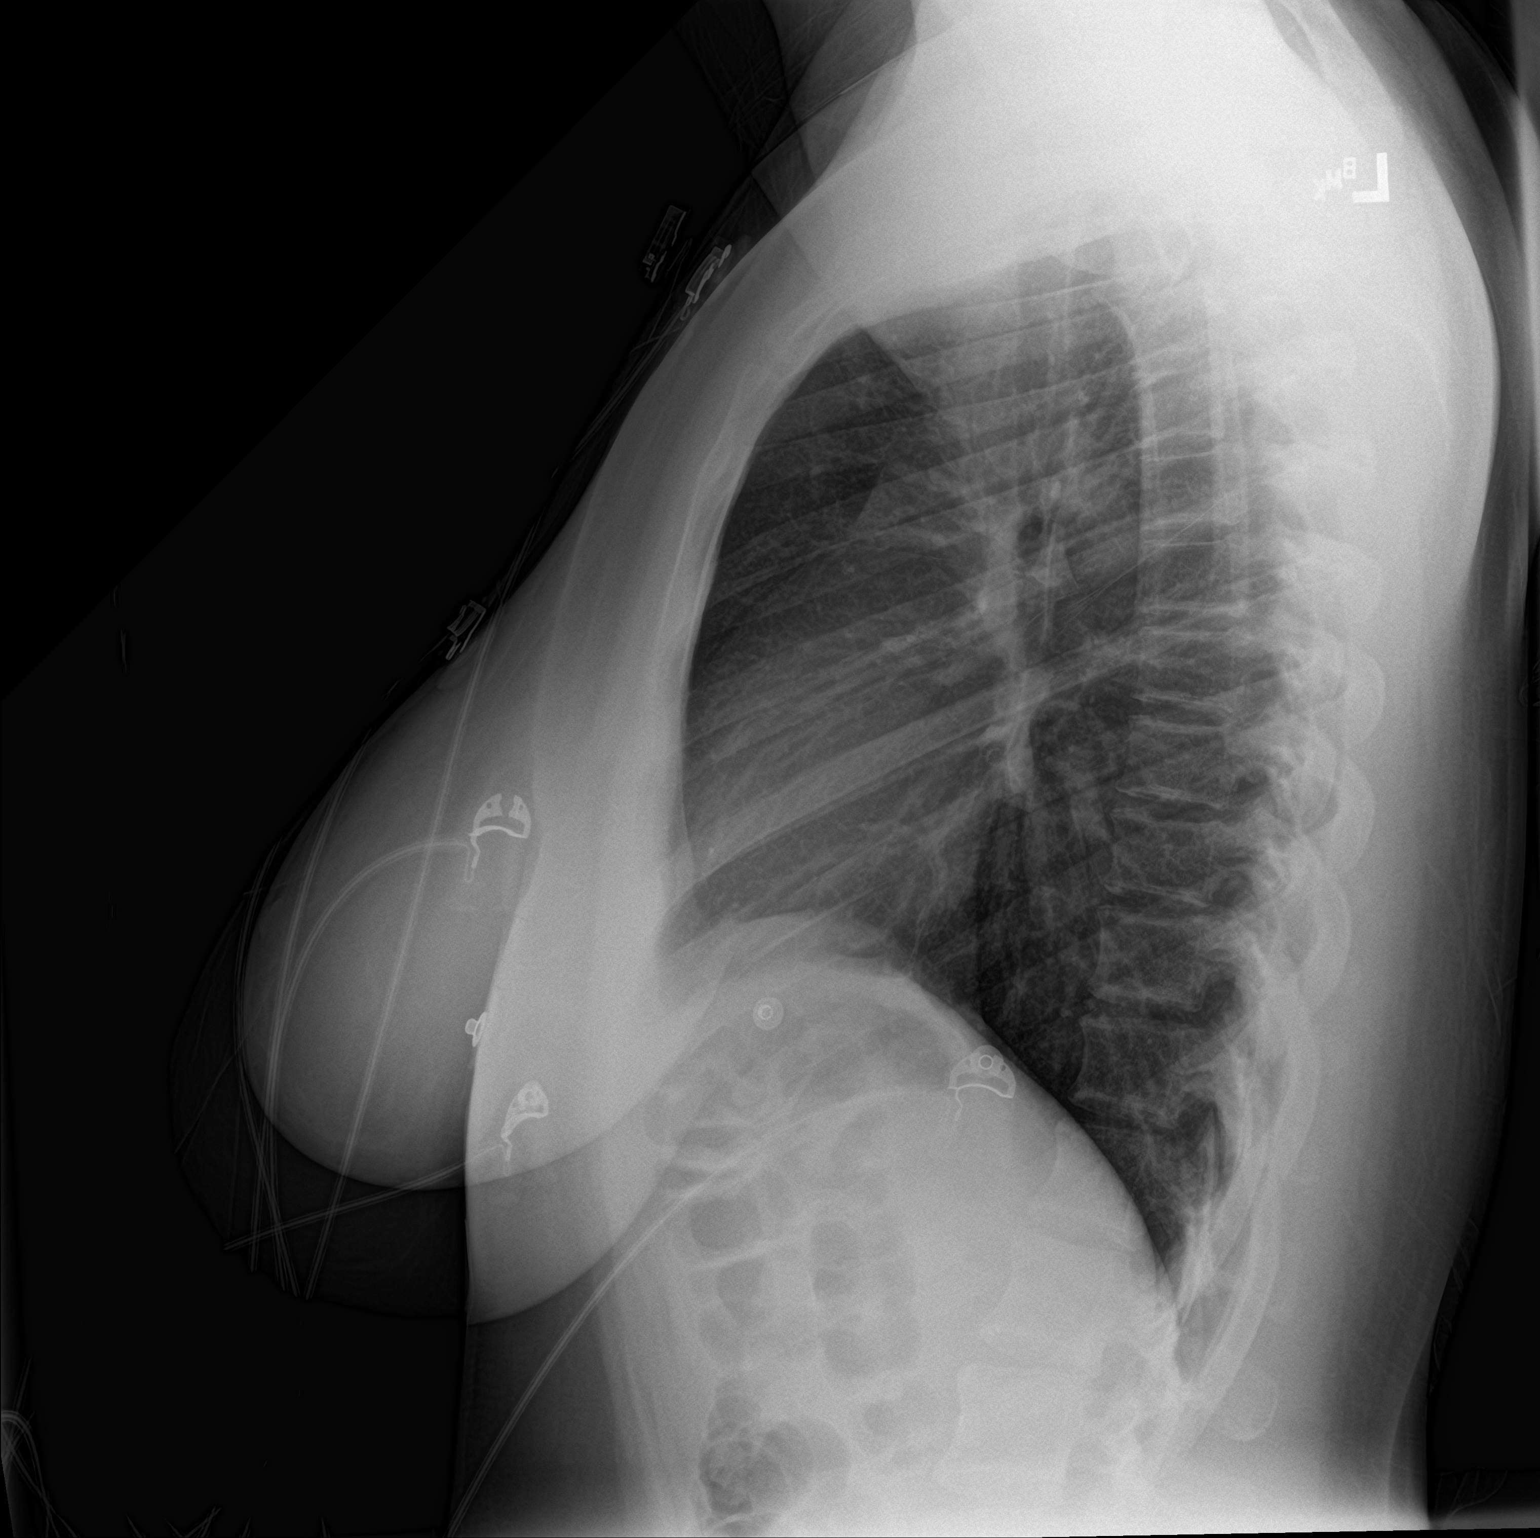

[2 of 2 positions shown; findings below may reference images not displayed]

FINDINGS: The heart size and mediastinal contours are within normal limits.
Both lungs are clear. The visualized skeletal structures are
unremarkable.
IMPRESSION: No active cardiopulmonary disease.

## 2020-02-14 ENCOUNTER — Ambulatory Visit (INDEPENDENT_AMBULATORY_CARE_PROVIDER_SITE_OTHER): Payer: Medicaid Other | Admitting: Obstetrics and Gynecology

## 2020-02-14 ENCOUNTER — Encounter: Payer: Self-pay | Admitting: Obstetrics and Gynecology

## 2020-02-14 ENCOUNTER — Other Ambulatory Visit: Payer: Self-pay

## 2020-02-14 ENCOUNTER — Other Ambulatory Visit (HOSPITAL_COMMUNITY)
Admission: RE | Admit: 2020-02-14 | Discharge: 2020-02-14 | Disposition: A | Discharge status: Home / Self Care | Payer: Medicaid Other | Source: Ambulatory Visit | Attending: Obstetrics and Gynecology | Admitting: Obstetrics and Gynecology

## 2020-02-14 VITALS — BP 116/72 | Ht 63.0 in | Wt 215.3 lb

## 2020-02-14 DIAGNOSIS — O98512 Other viral diseases complicating pregnancy, second trimester: Secondary | ICD-10-CM

## 2020-02-14 DIAGNOSIS — Z3403 Encounter for supervision of normal first pregnancy, third trimester: Secondary | ICD-10-CM | POA: Diagnosis present

## 2020-02-14 DIAGNOSIS — Z3A35 35 weeks gestation of pregnancy: Secondary | ICD-10-CM

## 2020-02-14 DIAGNOSIS — B009 Herpesviral infection, unspecified: Secondary | ICD-10-CM

## 2020-02-14 DIAGNOSIS — O99513 Diseases of the respiratory system complicating pregnancy, third trimester: Secondary | ICD-10-CM

## 2020-02-14 DIAGNOSIS — O26843 Uterine size-date discrepancy, third trimester: Secondary | ICD-10-CM

## 2020-02-14 DIAGNOSIS — J45909 Unspecified asthma, uncomplicated: Secondary | ICD-10-CM

## 2020-02-14 LAB — POCT URINALYSIS DIPSTICK OB
Glucose, UA: NEGATIVE
POC,PROTEIN,UA: NEGATIVE

## 2020-02-14 MED ORDER — VALACYCLOVIR HCL 500 MG PO TABS: 500.0000 mg | ORAL_TABLET | Freq: Two times a day (BID) | ORAL | 1 refills | Status: DC

## 2020-02-14 NOTE — Patient Instructions (Signed)
Pain Relief During Labor and Delivery Many things can cause pain during labor and delivery, including:  Pressure on bones and ligaments due to the baby moving through the pelvis.  Stretching of tissues due to the baby moving through the birth canal.  Muscle tension due to anxiety or nervousness.  The uterus tightening (contracting) and relaxing to help move the baby. There are many ways to deal with the pain of labor and delivery. They include:  Taking prenatal classes. Taking these classes helps you know what to expect during your baby's birth. What you learn will increase your confidence and decrease your anxiety.  Practicing relaxation techniques or doing relaxing activities, such as: ? Focused breathing. ? Meditation. ? Visualization. ? Aroma therapy. ? Listening to your favorite music. ? Hypnosis.  Taking a warm shower or bath (hydrotherapy). This may: ? Provide comfort and relaxation. ? Lessen your perception of pain. ? Decrease the amount of pain medicine needed. ? Decrease the length of labor.  Getting a massage or counterpressure on your back.  Applying warm packs or ice packs.  Changing positions often, moving around, or using a birthing ball.  Getting: ? Pain medicine through an IV or injection into a muscle. ? Pain medicine inserted into your spinal column. ? Injections of sterile water just under the skin on your lower back (intradermal injections). ? Laughing gas (nitrous oxide). Discuss your pain control options with your health care provider during your prenatal visits. Explore the options offered by your hospital or birth center. What kinds of medicine are available? There are two kinds of medicines that can be used to relieve pain during labor and delivery:  Analgesics. These medicines decrease pain without causing you to lose feeling or the ability to move your muscles.  Anesthetics. These medicines block feeling in the body and can decrease your  ability to move freely. Both of these kinds of medicine can cause minor side effects, such as nausea, trouble concentrating, and sleepiness. They can also decrease the baby's heart rate before birth and affect the baby's breathing rate after birth. For this reason, health care providers are careful about when and how much medicine is given. What are specific medicines and procedures that provide pain relief? Local Anesthetics Local anesthetics are used to numb a small area of the body. They may be used along with another kind of anesthetic or used to numb the nerves of the vagina, cervix, and perineum during the second stage of labor. General Anesthetics General anesthetics cause you to lose consciousness so you do not feel pain. They are usually only used for an emergency cesarean delivery. General anesthetics are given through an IV tube and a mask. Pudendal Block A pudendal block is a form of local anesthetic. It may be used to relieve the pain associated with pushing or stretching of the perineum at the time of delivery or to further numb the perineum. A pudendal block is done by injecting numbing medicine through the vaginal wall into a nerve in the pelvis. Epidural Analgesia Epidural analgesia is given through a flexible IV catheter that is inserted into the lower back. Numbing medicine is delivered continuously to the area near your spinal column nerves (epidural space). After having this type of analgesia, you may be able to move your legs but you most likely will not be able to walk. Depending on the amount of medicine given, you may lose all feeling in the lower half of your body, or you may retain some level   of sensation, including the urge to push. Epidural analgesia can be used to provide pain relief for a vaginal birth. Spinal Block A spinal block is similar to epidural analgesia, but the medicine is injected into the spinal fluid instead of the epidural space. A spinal block is only given  once. It starts to relieve pain quickly, but the pain relief lasts only 1-6 hours. Spinal blocks can be used for cesarean deliveries. Combined Spinal-Epidural (CSE) Block A CSE block combines the effects of a spinal block and epidural analgesia. The spinal block works quickly to block all pain. The epidural analgesia provides continuous pain relief, even after the effects of the spinal block have worn off. This information is not intended to replace advice given to you by your health care provider. Make sure you discuss any questions you have with your health care provider. Document Revised: 03/20/2017 Document Reviewed: 08/29/2015 Elsevier Patient Education  2020 [employer]   Vaginal Delivery  Vaginal delivery means that you give birth by pushing your baby out of your birth canal (vagina). A team of health care providers will help you before, during, and after vaginal delivery. Birth experiences are unique for every woman and every pregnancy, and birth experiences vary depending on where you choose to give birth. What happens when I arrive at the birth center or hospital? Once you are in labor and have been admitted into the hospital or birth center, your health care provider may:  Review your pregnancy history and any concerns that you have.  Insert an IV into one of your veins. This may be used to give you fluids and medicines.  Check your blood pressure, pulse, temperature, and heart rate (vital signs).  Check whether your bag of water (amniotic sac) has broken (ruptured).  Talk with you about your birth plan and discuss pain control options. Monitoring Your health care provider may monitor your contractions (uterine monitoring) and your baby's heart rate (fetal monitoring). You may need to be monitored:  Often, but not continuously (intermittently).  All the time or for long periods at a time (continuously). Continuous monitoring may be needed if: ? You are taking certain  medicines, such as medicine to relieve pain or make your contractions stronger. ? You have pregnancy or labor complications. Monitoring may be done by:  Placing a special stethoscope or a handheld monitoring device on your abdomen to check your baby's heartbeat and to check for contractions.  Placing monitors on your abdomen (external monitors) to record your baby's heartbeat and the frequency and length of contractions.  Placing monitors inside your uterus through your vagina (internal monitors) to record your baby's heartbeat and the frequency, length, and strength of your contractions. Depending on the type of monitor, it may remain in your uterus or on your baby's head until birth.  Telemetry. This is a type of continuous monitoring that can be done with external or internal monitors. Instead of having to stay in bed, you are able to move around during telemetry. Physical exam Your health care provider may perform frequent physical exams. This may include:  Checking how and where your baby is positioned in your uterus.  Checking your cervix to determine: ? Whether it is thinning out (effacing). ? Whether it is opening up (dilating). What happens during labor and delivery?  Normal labor and delivery is divided into the following three stages: Stage 1  This is the longest stage of labor.  This stage can last for hours or days.  Throughout  this stage, you will feel contractions. Contractions generally feel mild, infrequent, and irregular at first. They get stronger, more frequent (about every 2-3 minutes), and more regular as you move through this stage.  This stage ends when your cervix is completely dilated to 4 inches (10 cm) and completely effaced. Stage 2  This stage starts once your cervix is completely effaced and dilated and lasts until the delivery of your baby.  This stage may last from 20 minutes to 2 hours.  This is the stage where you will feel an urge to push your  baby out of your vagina.  You may feel stretching and burning pain, especially when the widest part of your baby's head passes through the vaginal opening (crowning).  Once your baby is delivered, the umbilical cord will be clamped and cut. This usually occurs after waiting a period of 1-2 minutes after delivery.  Your baby will be placed on your bare chest (skin-to-skin contact) in an upright position and covered with a warm blanket. Watch your baby for feeding cues, like rooting or sucking, and help the baby to your breast for his or her first feeding. Stage 3  This stage starts immediately after the birth of your baby and ends after you deliver the placenta.  This stage may take anywhere from 5 to 30 minutes.  After your baby has been delivered, you will feel contractions as your body expels the placenta and your uterus contracts to control bleeding. What can I expect after labor and delivery?  After labor is over, you and your baby will be monitored closely until you are ready to go home to ensure that you are both healthy. Your health care team will teach you how to care for yourself and your baby.  You and your baby will stay in the same room (rooming in) during your hospital stay. This will encourage early bonding and successful breastfeeding.  You may continue to receive fluids and medicines through an IV.  Your uterus will be checked and massaged regularly (fundal massage).  You will have some soreness and pain in your abdomen, vagina, and the area of skin between your vaginal opening and your anus (perineum).  If an incision was made near your vagina (episiotomy) or if you had some vaginal tearing during delivery, cold compresses may be placed on your episiotomy or your tear. This helps to reduce pain and swelling.  You may be given a squirt bottle to use instead of wiping when you go to the bathroom. To use the squirt bottle, follow these steps: ? Before you urinate, fill the  squirt bottle with warm water. Do not use hot water. ? After you urinate, while you are sitting on the toilet, use the squirt bottle to rinse the area around your urethra and vaginal opening. This rinses away any urine and blood. ? Fill the squirt bottle with clean water every time you use the bathroom.  It is normal to have vaginal bleeding after delivery. Wear a sanitary pad for vaginal bleeding and discharge. Summary  Vaginal delivery means that you will give birth by pushing your baby out of your birth canal (vagina).  Your health care provider may monitor your contractions (uterine monitoring) and your baby's heart rate (fetal monitoring).  Your health care provider may perform a physical exam.  Normal labor and delivery is divided into three stages.  After labor is over, you and your baby will be monitored closely until you are ready to go home.  This information is not intended to replace advice given to you by your health care provider. Make sure you discuss any questions you have with your health care provider. Document Revised: 05/12/2017 Document Reviewed: 05/12/2017 Elsevier Patient Education  2020 [employer].   Third Trimester of Pregnancy The third trimester is from week 28 through week 40 (months 7 through 9). The third trimester is a time when the unborn baby (fetus) is growing rapidly. At the end of the ninth month, the fetus is about 20 inches in length and weighs 6-10 pounds. Body changes during your third trimester Your body will continue to go through many changes during pregnancy. The changes vary from woman to woman. During the third trimester:  Your weight will continue to increase. You can expect to gain 25-35 pounds (11-16 kg) by the end of the pregnancy.  You may begin to get stretch marks on your hips, abdomen, and breasts.  You may urinate more often because the fetus is moving lower into your pelvis and pressing on your bladder.  You may develop or  continue to have heartburn. This is caused by increased hormones that slow down muscles in the digestive tract.  You may develop or continue to have constipation because increased hormones slow digestion and cause the muscles that push waste through your intestines to relax.  You may develop hemorrhoids. These are swollen veins (varicose veins) in the rectum that can itch or be painful.  You may develop swollen, bulging veins (varicose veins) in your legs.  You may have increased body aches in the pelvis, back, or thighs. This is due to weight gain and increased hormones that are relaxing your joints.  You may have changes in your hair. These can include thickening of your hair, rapid growth, and changes in texture. Some women also have hair loss during or after pregnancy, or hair that feels dry or thin. Your hair will most likely return to normal after your baby is born.  Your breasts will continue to grow and they will continue to become tender. A yellow fluid (colostrum) may leak from your breasts. This is the first milk you are producing for your baby.  Your belly button may stick out.  You may notice more swelling in your hands, face, or ankles.  You may have increased tingling or numbness in your hands, arms, and legs. The skin on your belly may also feel numb.  You may feel short of breath because of your expanding uterus.  You may have more problems sleeping. This can be caused by the size of your belly, increased need to urinate, and an increase in your body's metabolism.  You may notice the fetus "dropping," or moving lower in your abdomen (lightening).  You may have increased vaginal discharge.  You may notice your joints feel loose and you may have pain around your pelvic bone. What to expect at prenatal visits You will have prenatal exams every 2 weeks until week 36. Then you will have weekly prenatal exams. During a routine prenatal visit:  You will be weighed to make  sure you and the baby are growing normally.  Your blood pressure will be taken.  Your abdomen will be measured to track your baby's growth.  The fetal heartbeat will be listened to.  Any test results from the previous visit will be discussed.  You may have a cervical check near your due date to see if your cervix has softened or thinned (effaced).  You will be  tested for Group B streptococcus. This happens between 35 and 37 weeks. Your health care provider may ask you:  What your birth plan is.  How you are feeling.  If you are feeling the baby move.  If you have had any abnormal symptoms, such as leaking fluid, bleeding, severe headaches, or abdominal cramping.  If you are using any tobacco products, including cigarettes, chewing tobacco, and electronic cigarettes.  If you have any questions. Other tests or screenings that may be performed during your third trimester include:  Blood tests that check for low iron levels (anemia).  Fetal testing to check the health, activity level, and growth of the fetus. Testing is done if you have certain medical conditions or if there are problems during the pregnancy.  Nonstress test (NST). This test checks the health of your baby to make sure there are no signs of problems, such as the baby not getting enough oxygen. During this test, a belt is placed around your belly. The baby is made to move, and its heart rate is monitored during movement. What is false labor? False labor is a condition in which you feel small, irregular tightenings of the muscles in the womb (contractions) that usually go away with rest, changing position, or drinking water. These are called Braxton Hicks contractions. Contractions may last for hours, days, or even weeks before true labor sets in. If contractions come at regular intervals, become more frequent, increase in intensity, or become painful, you should see your health care provider. What are the signs of  labor?  Abdominal cramps.  Regular contractions that start at 10 minutes apart and become stronger and more frequent with time.  Contractions that start on the top of the uterus and spread down to the lower abdomen and back.  Increased pelvic pressure and dull back pain.  A watery or bloody mucus discharge that comes from the vagina.  Leaking of amniotic fluid. This is also known as your "water breaking." It could be a slow trickle or a gush. Let your health care provider know if it has a color or strange odor. If you have any of these signs, call your health care provider right away, even if it is before your due date. Follow these instructions at home: Medicines  Follow your health care provider's instructions regarding medicine use. Specific medicines may be either safe or unsafe to take during pregnancy.  Take a prenatal vitamin that contains at least 600 micrograms (mcg) of folic acid.  If you develop constipation, try taking a stool softener if your health care provider approves. Eating and drinking   Eat a balanced diet that includes fresh fruits and vegetables, whole grains, good sources of protein such as meat, eggs, or tofu, and low-fat dairy. Your health care provider will help you determine the amount of weight gain that is right for you.  Avoid raw meat and uncooked cheese. These carry germs that can cause birth defects in the baby.  If you have low calcium intake from food, talk to your health care provider about whether you should take a daily calcium supplement.  Eat four or five small meals rather than three large meals a day.  Limit foods that are high in fat and processed sugars, such as fried and sweet foods.  To prevent constipation: ? Drink enough fluid to keep your urine clear or pale yellow. ? Eat foods that are high in fiber, such as fresh fruits and vegetables, whole grains, and beans. Activity  Exercise only as directed by your health care provider.  Most women can continue their usual exercise routine during pregnancy. Try to exercise for 30 minutes at least 5 days a week. Stop exercising if you experience uterine contractions.  Avoid heavy lifting.  Do not exercise in extreme heat or humidity, or at high altitudes.  Wear low-heel, comfortable shoes.  Practice good posture.  You may continue to have sex unless your health care provider tells you otherwise. Relieving pain and discomfort  Take frequent breaks and rest with your legs elevated if you have leg cramps or low back pain.  Take warm sitz baths to soothe any pain or discomfort caused by hemorrhoids. Use hemorrhoid cream if your health care provider approves.  Wear a good support bra to prevent discomfort from breast tenderness.  If you develop varicose veins: ? Wear support pantyhose or compression stockings as told by your healthcare provider. ? Elevate your feet for 15 minutes, 3-4 times a day. Prenatal care  Write down your questions. Take them to your prenatal visits.  Keep all your prenatal visits as told by your health care provider. This is important. Safety  Wear your seat belt at all times when driving.  Make a list of emergency phone numbers, including numbers for family, friends, the hospital, and police and fire departments. General instructions  Avoid cat litter boxes and soil used by cats. These carry germs that can cause birth defects in the baby. If you have a cat, ask someone to clean the litter box for you.  Do not travel far distances unless it is absolutely necessary and only with the approval of your health care provider.  Do not use hot tubs, steam rooms, or saunas.  Do not drink alcohol.  Do not use any products that contain nicotine or tobacco, such as cigarettes and e-cigarettes. If you need help quitting, ask your health care provider.  Do not use any medicinal herbs or unprescribed drugs. These chemicals affect the formation and  growth of the baby.  Do not douche or use tampons or scented sanitary pads.  Do not cross your legs for long periods of time.  To prepare for the arrival of your baby: ? Take prenatal classes to understand, practice, and ask questions about labor and delivery. ? Make a trial run to the hospital. ? Visit the hospital and tour the maternity area. ? Arrange for maternity or paternity leave through employers. ? Arrange for family and friends to take care of pets while you are in the hospital. ? Purchase a rear-facing car seat and make sure you know how to install it in your car. ? Pack your hospital bag. ? Prepare the baby's nursery. Make sure to remove all pillows and stuffed animals from the baby's crib to prevent suffocation.  Visit your dentist if you have not gone during your pregnancy. Use a soft toothbrush to brush your teeth and be gentle when you floss. Contact a health care provider if:  You are unsure if you are in labor or if your water has broken.  You become dizzy.  You have mild pelvic cramps, pelvic pressure, or nagging pain in your abdominal area.  You have lower back pain.  You have persistent nausea, vomiting, or diarrhea.  You have an unusual or bad smelling vaginal discharge.  You have pain when you urinate. Get help right away if:  Your water breaks before 37 weeks.  You have regular contractions less than 5 minutes apart before  37 weeks.  You have a fever.  You are leaking fluid from your vagina.  You have spotting or bleeding from your vagina.  You have severe abdominal pain or cramping.  You have rapid weight loss or weight gain.  You have shortness of breath with chest pain.  You notice sudden or extreme swelling of your face, hands, ankles, feet, or legs.  Your baby makes fewer than 10 movements in 2 hours.  You have severe headaches that do not go away when you take medicine.  You have vision changes. Summary  The third trimester is  from week 28 through week 40, months 7 through 9. The third trimester is a time when the unborn baby (fetus) is growing rapidly.  During the third trimester, your discomfort may increase as you and your baby continue to gain weight. You may have abdominal, leg, and back pain, sleeping problems, and an increased need to urinate.  During the third trimester your breasts will keep growing and they will continue to become tender. A yellow fluid (colostrum) may leak from your breasts. This is the first milk you are producing for your baby.  False labor is a condition in which you feel small, irregular tightenings of the muscles in the womb (contractions) that eventually go away. These are called Braxton Hicks contractions. Contractions may last for hours, days, or even weeks before true labor sets in.  Signs of labor can include: abdominal cramps; regular contractions that start at 10 minutes apart and become stronger and more frequent with time; watery or bloody mucus discharge that comes from the vagina; increased pelvic pressure and dull back pain; and leaking of amniotic fluid. This information is not intended to replace advice given to you by your health care provider. Make sure you discuss any questions you have with your health care provider. Document Revised: 07/29/2018 Document Reviewed: 05/13/2016 Elsevier Patient Education  2020 [employer]   Augmentation of Labor  Augmentation of labor is when steps are taken to stimulate and strengthen contractions of the uterus during labor. This may be done when contractions have slowed down or stopped, delaying progress of labor and delivery of the baby. Before beginning augmentation of labor, your health care provider will evaluate your condition, your baby's condition, the size and position of your baby, and the size of your birth canal. What are some reasons for labor augmentation? Augmentation of labor may be needed when:  You are in labor but  your contractions are weak or irregular.  You are in labor but your contractions have stopped. What methods are used for labor augmentation? Labor augmentation may be done by:  Giving medicine that stimulates contractions (oxytocin). This is given through an IV tube that is inserted into one of your veins.  Breaking the fluid-filled sac that surrounds the fetus (amniotic sac). What are the risks associated with labor augmentation? Some risks of labor augmentation include:  Too much stimulation of the contractions, resulting in continuous, prolonged, or very strong contractions.  Increased risk of infection for you and your baby.  Tearing (rupture) of the uterus.  Breaking off (abruption) of the placenta.  Increased risk of cesarean, forceps, or vacuum delivery.  Excessive bleeding after delivery (postpartum hemorrhage).  Death of the baby (fetal death). What are some reasons for not doing labor augmentation? Augmentation of labor should not be done if:  The baby is too big for the birth canal. This can be confirmed with an ultrasound.  The umbilical cord drops  in front of the baby's head or breech part (prolapsed cord).  You have had a cesarean delivery and you had a vertical incision or you do not know what type of incision you had.  You have had surgery on or into your uterus.  You have an active herpes outbreak.  You have cervical cancer.  The placenta blocks the opening of the cervix (placenta previa) or you have other condition that is blocking the cervix or vaginal outlet.  The baby is lying sideways.  Your pelvis is will not permit the passage of the baby.  You are carrying more than two babies. Summary  Augmentation of labor is when steps are taken to stimulate and strengthen contractions of the uterus during labor. This may be done when contractions have slowed down or stopped, delaying progress of labor and delivery of the baby.  Labor augmentation may be  done using medicine to stimulate contractions (oxytocin) or by breaking the fluid-filled sac that surrounds the fetus (amniotic sac).  Labor should not be augmented if you have had a cesarean delivery and you had a vertical incision or you do not know what type of incision you had. This information is not intended to replace advice given to you by your health care provider. Make sure you discuss any questions you have with your health care provider. Document Revised: 02/01/2019 Document Reviewed: 05/12/2016 Elsevier Patient Education  2020 [employer].

## 2020-02-14 NOTE — Progress Notes (Addendum)
Routine Prenatal Care Visit  Subjective  Sarah Oneill is a 19 y.o. G1P0000 at [redacted]w[redacted]d being seen today for ongoing prenatal care.  She is currently monitored for the following issues for this low-risk pregnancy and has Anxiety state; ADHD (attention deficit hyperactivity disorder); Astigmatism; Bee sting allergy; BMI (body mass index), pediatric, greater than or equal to 95% for age; Chronic constipation; Insulin resistance; Recurrent UTI; Shellfish allergy; Encounter for supervision of normal first pregnancy in third trimester; and HSV (herpes simplex virus) anogenital infection on their problem list.  ----------------------------------------------------------------------------------- Patient reports general discomfort.   Contractions: Not present. Vag. Bleeding: None.  Movement: Present. Denies leaking of fluid.  ----------------------------------------------------------------------------------- The following portions of the patient's history were reviewed and updated as appropriate: allergies, current medications, past family history, past medical history, past social history, past surgical history and problem list. Problem list updated.   Objective  Blood pressure 116/72, height 5\' 3"  (1.6 m), weight 215 lb 4.8 oz (97.7 kg), last menstrual period 06/06/2019. Pregravid weight 172 lb (78 kg) Total Weight Gain 43 lb 4.8 oz (19.6 kg) Urinalysis:      Fetal Status: Fetal Heart Rate (bpm): 140 Fundal Height: 39 cm Movement: Present     General:  Alert, oriented and cooperative. Patient is in no acute distress.  Skin: Skin is warm and dry. No rash noted.   Cardiovascular: Normal heart rate noted  Respiratory: Normal respiratory effort, no problems with respiration noted  Abdomen: Soft, gravid, appropriate for gestational age. Pain/Pressure: Absent     Pelvic:  Cervical exam performed Dilation: Fingertip Effacement (%): 0 Station: -3  Extremities: Normal range of motion.  Edema: None    Mental Status: Normal mood and affect. Normal behavior. Normal judgment and thought content.     Assessment   19 y.o. G1P0000 at [redacted]w[redacted]d by  03/18/2020, by Ultrasound presenting for routine prenatal visit  Plan   Pregnancy #1 Problems (from 08/02/19 to present)    Problem Noted Resolved   HSV (herpes simplex virus) anogenital infection 10/10/2019 by 10/12/2019, CNM No   Encounter for supervision of normal first pregnancy in third trimester 08/03/2019 by 08/05/2019, CNM No   Overview Addendum 02/14/2020  9:53 AM by 02/16/2020, MD    [facility] Prenatal Labs  Dating  9 wk Natale Milch Blood type: O/Positive/-- (05/11 1023)   Genetic Screen  NIPS: normal XY Antibody:Negative (05/11 1023)  Anatomic 06-17-1975 [facility] nml Rubella: 2.44 (05/11 1023)  Varicella: Immune  GTT 8/31 RPR: Non Reactive (05/11 1023)   Rhogam Not needed HBsAg: Negative (05/11 1023)   Vaccines TDAP:  9/14                  Flu Shot:declines HIV: Non Reactive (05/11 1023)   Baby Food Breast                               GBS:   Contraception  Pap: < 21 y.o.  CBB  No  Valtrex @ 36 weeks [ ]    CS/VBAC NA HgbE: normal  Support Person 06-17-1975    Pregnancy Diagnoses:  HSV infection during pregnancy Chlamydia infection during pregnancy       Previous Version      GBS/ GC/CT today Vertex on bedside Given RX valtrex Desires Nexplanon postpartum  Gestational age appropriate obstetric precautions including but not limited to vaginal bleeding, contractions, leaking of fluid and fetal movement were  reviewed in detail with the patient.    Return in about 1 week (around 02/21/2020) for ROB and growth/afi [place].  Natale Milch MD [facility], [facility] 02/14/2020, 10:17 AM

## 2020-02-15 LAB — CERVICOVAGINAL ANCILLARY ONLY
Chlamydia: NEGATIVE
Comment: NEGATIVE
Comment: NORMAL
Neisseria Gonorrhea: NEGATIVE

## 2020-02-17 LAB — CULTURE, BETA STREP (GROUP B ONLY): Strep Gp B Culture: POSITIVE — AB

## 2020-02-22 ENCOUNTER — Ambulatory Visit (INDEPENDENT_AMBULATORY_CARE_PROVIDER_SITE_OTHER): Payer: Medicaid Other | Admitting: Obstetrics & Gynecology

## 2020-02-22 ENCOUNTER — Other Ambulatory Visit: Payer: Self-pay

## 2020-02-22 ENCOUNTER — Encounter: Payer: Self-pay | Admitting: Obstetrics & Gynecology

## 2020-02-22 ENCOUNTER — Ambulatory Visit (INDEPENDENT_AMBULATORY_CARE_PROVIDER_SITE_OTHER): Payer: Medicaid Other

## 2020-02-22 VITALS — BP 100/60 | Wt 215.0 lb

## 2020-02-22 DIAGNOSIS — Z3403 Encounter for supervision of normal first pregnancy, third trimester: Secondary | ICD-10-CM

## 2020-02-22 DIAGNOSIS — Z3A36 36 weeks gestation of pregnancy: Secondary | ICD-10-CM

## 2020-02-22 LAB — POCT URINALYSIS DIPSTICK OB
Glucose, UA: NEGATIVE
POC,PROTEIN,UA: NEGATIVE

## 2020-02-22 NOTE — Progress Notes (Signed)
  Subjective  Fetal Movement? yes Contractions? no Leaking Fluid? no Vaginal Bleeding? no  Objective  BP 100/60   Wt 215 lb (97.5 kg)   LMP 06/06/2019 (Approximate)   BMI 38.09 kg/m  General: NAD Pumonary: no increased work of breathing Abdomen: gravid, non-tender Extremities: no edema Psychiatric: mood appropriate, affect full  Review of ULTRASOUND.    I have personally reviewed images and report of recent ultrasound done at [facility].    Plan of management to be discussed with patient.    AFI nml, Growth 40%/normal, Vtx  Assessment  19 y.o. G1P0000 at [redacted]w[redacted]d by  03/18/2020, by Ultrasound presenting for routine prenatal visit  Plan   Problem List Items Addressed This Visit      Other   Encounter for supervision of normal first pregnancy in third trimester    Other Visit Diagnoses    [redacted] weeks gestation of pregnancy    -  Primary   Relevant Orders   POC Urinalysis Dipstick OB (Completed)      Pregnancy #1 Problems (from 08/02/19 to present)    Problem Noted Resolved   HSV (herpes simplex virus) anogenital infection 10/10/2019 by Tresea Mall, CNM No   Encounter for supervision of normal first pregnancy in third trimester 08/03/2019 by Tresea Mall, CNM No   Overview Addendum 02/22/2020  3:31 PM by Nadara Mustard, MD    [facility] Prenatal Labs  Dating  9 wk [place] Blood type: O/Positive/-- (05/11 1023)   Genetic Screen  NIPS: normal XY Antibody:Negative (05/11 1023)  Anatomic [place] [facility] nml Rubella: 2.44 (05/11 1023)  Varicella: Immune  GTT 8/31 RPR: Non Reactive (08/31 1032)   Rhogam Not needed HBsAg: Negative (05/11 1023)   Vaccines TDAP:  9/14                  Flu Shot:declines HIV: Non Reactive (08/31 1032)   Baby Food Breast                               [id number]/-- (10/26 1542)  Contraception Nexplanon- may desire immediate postpartum delivery Pap: < 21 y.o.  CBB  No  Valtrex @ 36 weeks [ x]   CS/VBAC NA HgbE: normal  Support Person [name]     Pregnancy Diagnoses:  HSV infection during pregnancy- given RX Chlamydia infection during pregnancy       Previous Version     PNV, FMC, Labor precautions  [place] discussed, no growth concerns  GBS POS discussed tx in labor  Annamarie Major, MD, [facility], [facility] 02/22/2020  3:41 PM

## 2020-02-29 ENCOUNTER — Other Ambulatory Visit: Payer: Self-pay

## 2020-02-29 ENCOUNTER — Ambulatory Visit (INDEPENDENT_AMBULATORY_CARE_PROVIDER_SITE_OTHER): Payer: Medicaid Other | Admitting: Advanced Practice Midwife

## 2020-02-29 ENCOUNTER — Encounter: Payer: Self-pay | Admitting: Advanced Practice Midwife

## 2020-02-29 VITALS — BP 131/83 | Wt 219.0 lb

## 2020-02-29 DIAGNOSIS — Z3A37 37 weeks gestation of pregnancy: Secondary | ICD-10-CM

## 2020-02-29 DIAGNOSIS — Z3403 Encounter for supervision of normal first pregnancy, third trimester: Secondary | ICD-10-CM

## 2020-02-29 NOTE — Progress Notes (Signed)
ROB- no concerns 

## 2020-02-29 NOTE — Progress Notes (Signed)
Routine Prenatal Care Visit  Subjective  Sarah Oneill is a 19 y.o. G1P0000 at [redacted]w[redacted]d being seen today for ongoing prenatal care.  She is currently monitored for the following issues for this low-risk pregnancy and has Anxiety state; ADHD (attention deficit hyperactivity disorder); Astigmatism; Bee sting allergy; BMI (body mass index), pediatric, greater than or equal to 95% for age; Chronic constipation; Insulin resistance; Recurrent UTI; Shellfish allergy; Encounter for supervision of normal first pregnancy in third trimester; and HSV (herpes simplex virus) anogenital infection on their problem list.  ----------------------------------------------------------------------------------- Patient reports no complaints.  She denies headaches, visual changes or epigastric pain. She is taking valtrex as ordered. She denies any current lesions. Contractions: Not present. Vag. Bleeding: None.  Movement: Present. Leaking Fluid denies.  ----------------------------------------------------------------------------------- The following portions of the patient's history were reviewed and updated as appropriate: allergies, current medications, past family history, past medical history, past social history, past surgical history and problem list. Problem list updated.  Objective  Blood pressure 131/83, weight 219 lb (99.3 kg), last menstrual period 06/06/2019. BP recheck: 128/82  Pregravid weight 172 lb (78 kg) Total Weight Gain 47 lb (21.3 kg) Urinalysis: Urine Protein    Urine Glucose    Fetal Status: Fetal Heart Rate (bpm): 127 Fundal Height: 39 cm Movement: Present     General:  Alert, oriented and cooperative. Patient is in no acute distress.  Skin: Skin is warm and dry. No rash noted.   Cardiovascular: Normal heart rate noted  Respiratory: Normal respiratory effort, no problems with respiration noted  Abdomen: Soft, gravid, appropriate for gestational age. Pain/Pressure: Absent     Pelvic:  Cervical  exam deferred        Extremities: Normal range of motion.     Mental Status: Normal mood and affect. Normal behavior. Normal judgment and thought content.   Assessment   19 y.o. G1P0000 at [redacted]w[redacted]d by  03/18/2020, by Ultrasound presenting for routine prenatal visit  Plan   Pregnancy #1 Problems (from 08/02/19 to present)    Problem Noted Resolved   HSV (herpes simplex virus) anogenital infection 10/10/2019 by Tresea Mall, CNM No   Encounter for supervision of normal first pregnancy in third trimester 08/03/2019 by Tresea Mall, CNM No   Overview Addendum 02/22/2020  3:31 PM by Nadara Mustard, MD    [facility]inic [facility] Prenatal Labs  Dating  9 wk [place] Blood type: O/Positive/-- (05/11 1023)   Genetic Screen  NIPS: normal XY Antibody:Negative (05/11 1023)  Anatomic [place] [facility] nml Rubella: 2.44 (05/11 1023)  Varicella: Immune  GTT 8/31 RPR: Non Reactive (08/31 1032)   Rhogam Not needed HBsAg: Negative (05/11 1023)   Vaccines TDAP:  9/14                  Flu Shot:declines HIV: Non Reactive (08/31 1032)   Baby Food Breast                               [id number]/-- (10/26 1542)  Contraception Nexplanon- may desire immediate postpartum delivery Pap: < 21 y.o.  CBB  No  Valtrex @ 36 weeks [ x]   CS/VBAC NA HgbE: normal  Support Person [name]    Pregnancy Diagnoses:  HSV infection during pregnancy- given RX Chlamydia infection during pregnancy       Previous Version       Term labor symptoms and general obstetric precautions including but not limited to vaginal bleeding, contractions,  leaking of fluid and fetal movement were reviewed in detail with the patient. Please refer to After Visit Summary for other counseling recommendations.   Return in about 1 week (around 03/07/2020) for rob.  Tresea Mall, CNM 02/29/2020 10:52 AM

## 2020-03-07 ENCOUNTER — Other Ambulatory Visit: Payer: Self-pay

## 2020-03-07 ENCOUNTER — Ambulatory Visit (INDEPENDENT_AMBULATORY_CARE_PROVIDER_SITE_OTHER): Payer: Medicaid Other | Admitting: Obstetrics and Gynecology

## 2020-03-07 ENCOUNTER — Encounter: Payer: Self-pay | Admitting: Obstetrics and Gynecology

## 2020-03-07 VITALS — BP 126/84 | Wt 221.0 lb

## 2020-03-07 DIAGNOSIS — O99213 Obesity complicating pregnancy, third trimester: Secondary | ICD-10-CM

## 2020-03-07 DIAGNOSIS — Z3403 Encounter for supervision of normal first pregnancy, third trimester: Secondary | ICD-10-CM

## 2020-03-07 DIAGNOSIS — Z3A38 38 weeks gestation of pregnancy: Secondary | ICD-10-CM

## 2020-03-07 DIAGNOSIS — A609 Anogenital herpesviral infection, unspecified: Secondary | ICD-10-CM

## 2020-03-07 DIAGNOSIS — O99210 Obesity complicating pregnancy, unspecified trimester: Secondary | ICD-10-CM | POA: Insufficient documentation

## 2020-03-07 NOTE — Patient Instructions (Signed)
Induction Information: Your induction date: 03/16/2020 at 0800 Covid test: 03/14/2020 between 9-10AM. [facility].  Wear a mask and stay in your car.   This should not take long.  On your induction date, go to the ER a little before your induction time and let them know you're there for your labor induction.

## 2020-03-07 NOTE — Progress Notes (Signed)
Routine Prenatal Care Visit  Subjective  Sarah Oneill is a 19 y.o. G1P0000 at [redacted]w[redacted]d being seen today for ongoing prenatal care.  She is currently monitored for the following issues for this high-risk pregnancy and has Anxiety state; ADHD (attention deficit hyperactivity disorder); Astigmatism; Bee sting allergy; BMI (body mass index), pediatric, greater than or equal to 95% for age; Chronic constipation; Insulin resistance; Recurrent UTI; Shellfish allergy; Encounter for supervision of normal first pregnancy in third trimester; HSV (herpes simplex virus) anogenital infection; and Obesity affecting pregnancy on their problem list.  ----------------------------------------------------------------------------------- Patient reports no complaints.   Contractions: Irritability. Vag. Bleeding: None.  Movement: Present. Leaking Fluid denies.  ----------------------------------------------------------------------------------- The following portions of the patient's history were reviewed and updated as appropriate: allergies, current medications, past family history, past medical history, past social history, past surgical history and problem list. Problem list updated.  Objective  Blood pressure 126/84, weight 221 lb (100.2 kg), last menstrual period 06/06/2019. Pregravid weight 172 lb (78 kg) Total Weight Gain 49 lb (22.2 kg) Urinalysis: Urine Protein    Urine Glucose    Fetal Status: Fetal Heart Rate (bpm): 130 Fundal Height: 39 cm Movement: Present  Presentation: Vertex  General:  Alert, oriented and cooperative. Patient is in no acute distress.  Skin: Skin is warm and dry. No rash noted.   Cardiovascular: Normal heart rate noted  Respiratory: Normal respiratory effort, no problems with respiration noted  Abdomen: Soft, gravid, appropriate for gestational age. Pain/Pressure: Present     Pelvic:  Cervical exam deferred        Extremities: Normal range of motion.  Edema: None  Mental Status:  Normal mood and affect. Normal behavior. Normal judgment and thought content.   Assessment   19 y.o. G1P0000 at [redacted]w[redacted]d by  03/18/2020, by Ultrasound presenting for routine prenatal visit  Plan   Pregnancy #1 Problems (from 08/02/19 to present)    Problem Noted Resolved   Obesity affecting pregnancy 03/07/2020 by Conard Novak, MD No   HSV (herpes simplex virus) anogenital infection 10/10/2019 by Tresea Mall, CNM No   Encounter for supervision of normal first pregnancy in third trimester 08/03/2019 by Tresea Mall, CNM No   Overview Addendum 02/22/2020  3:31 PM by Nadara Mustard, MD    [facility]inic [facility] Prenatal Labs  Dating  9 wk [place] Blood type: O/Positive/-- (05/11 1023)   Genetic Screen  NIPS: normal XY Antibody:Negative (05/11 1023)  Anatomic [place] [facility] nml Rubella: 2.44 (05/11 1023)  Varicella: Immune  GTT 8/31 RPR: Non Reactive (08/31 1032)   Rhogam Not needed HBsAg: Negative (05/11 1023)   Vaccines TDAP:  9/14                  Flu Shot:declines HIV: Non Reactive (08/31 1032)   Baby Food Breast                               [id number]/-- (10/26 1542)  Contraception Nexplanon- may desire immediate postpartum delivery Pap: < 21 y.o.  CBB  No  Valtrex @ 36 weeks [ x]   CS/VBAC NA HgbE: normal  Support Person [name]    Pregnancy Diagnoses:  HSV infection during pregnancy- given RX Chlamydia infection during pregnancy       Previous Version       Term labor symptoms and general obstetric precautions including but not limited to vaginal bleeding, contractions, leaking of fluid and fetal movement were reviewed  in detail with the patient. Please refer to After Visit Summary for other counseling recommendations.   IOL scheduled for 11/26 at 0800, admission orders entered and instructions given.   Return in about 1 week (around 03/14/2020) for Routine Prenatal Appointment.   Thomasene Mohair, MD, [facility], [facility] 03/07/2020 1:27 PM

## 2020-03-10 ENCOUNTER — Encounter: Payer: Self-pay | Admitting: Obstetrics and Gynecology

## 2020-03-10 ENCOUNTER — Inpatient Hospital Stay
Admission: EM | Admit: 2020-03-10 | Discharge: 2020-03-11 | Disposition: A | Discharge status: Home / Self Care | Payer: Medicaid Other | Attending: Obstetrics and Gynecology | Admitting: Obstetrics and Gynecology

## 2020-03-10 ENCOUNTER — Other Ambulatory Visit: Payer: Self-pay

## 2020-03-10 DIAGNOSIS — O26893 Other specified pregnancy related conditions, third trimester: Secondary | ICD-10-CM | POA: Diagnosis not present

## 2020-03-10 DIAGNOSIS — O2643 Herpes gestationis, third trimester: Secondary | ICD-10-CM | POA: Diagnosis not present

## 2020-03-10 DIAGNOSIS — Z8616 Personal history of COVID-19: Secondary | ICD-10-CM | POA: Insufficient documentation

## 2020-03-10 DIAGNOSIS — O99210 Obesity complicating pregnancy, unspecified trimester: Secondary | ICD-10-CM | POA: Diagnosis present

## 2020-03-10 DIAGNOSIS — O133 Gestational [pregnancy-induced] hypertension without significant proteinuria, third trimester: Secondary | ICD-10-CM | POA: Diagnosis not present

## 2020-03-10 DIAGNOSIS — Z3403 Encounter for supervision of normal first pregnancy, third trimester: Secondary | ICD-10-CM

## 2020-03-10 DIAGNOSIS — A609 Anogenital herpesviral infection, unspecified: Secondary | ICD-10-CM

## 2020-03-10 DIAGNOSIS — R102 Pelvic and perineal pain: Secondary | ICD-10-CM

## 2020-03-10 DIAGNOSIS — Z3A38 38 weeks gestation of pregnancy: Secondary | ICD-10-CM | POA: Diagnosis not present

## 2020-03-10 DIAGNOSIS — O163 Unspecified maternal hypertension, third trimester: Secondary | ICD-10-CM

## 2020-03-10 DIAGNOSIS — Z7722 Contact with and (suspected) exposure to environmental tobacco smoke (acute) (chronic): Secondary | ICD-10-CM | POA: Diagnosis not present

## 2020-03-10 DIAGNOSIS — O99213 Obesity complicating pregnancy, third trimester: Secondary | ICD-10-CM

## 2020-03-10 MED ORDER — ACETAMINOPHEN 325 MG PO TABS
650.0000 mg | ORAL_TABLET | ORAL | Status: DC | PRN
  Administered 2020-03-10: 650 mg via ORAL
  Filled 2020-03-10: qty 2

## 2020-03-10 NOTE — OB Triage Note (Signed)
Pt is a 19y/o G1P0 at [redacted]w[redacted]d with c/o lower back pain that has been occurring  but got worse today rating 10/10 today but has now came down to 6/10. Pt states she was having random sharp pains that radiate to her pelvis. Pt states +FM. Pt denies LOF and VB. Monitors applied and assessing. Initial FHT 135.

## 2020-03-11 DIAGNOSIS — O99213 Obesity complicating pregnancy, third trimester: Secondary | ICD-10-CM | POA: Diagnosis not present

## 2020-03-11 DIAGNOSIS — O163 Unspecified maternal hypertension, third trimester: Secondary | ICD-10-CM

## 2020-03-11 LAB — CBC
HCT: 29.2 % — ABNORMAL LOW (ref 36.0–46.0)
Hemoglobin: 9.4 g/dL — ABNORMAL LOW (ref 12.0–15.0)
MCH: 27.3 pg (ref 26.0–34.0)
MCHC: 32.2 g/dL (ref 30.0–36.0)
MCV: 84.9 fL (ref 80.0–100.0)
Platelets: 175 10*3/uL (ref 150–400)
RBC: 3.44 MIL/uL — ABNORMAL LOW (ref 3.87–5.11)
RDW: 15.9 % — ABNORMAL HIGH (ref 11.5–15.5)
WBC: 9.9 10*3/uL (ref 4.0–10.5)
nRBC: 0 % (ref 0.0–0.2)

## 2020-03-11 LAB — WET PREP, GENITAL
Clue Cells Wet Prep HPF POC: NONE SEEN
Sperm: NONE SEEN
Trich, Wet Prep: NONE SEEN
Yeast Wet Prep HPF POC: NONE SEEN

## 2020-03-11 LAB — COMPREHENSIVE METABOLIC PANEL
ALT: 10 U/L (ref 0–44)
AST: 15 U/L (ref 15–41)
Albumin: 2.8 g/dL — ABNORMAL LOW (ref 3.5–5.0)
Alkaline Phosphatase: 100 U/L (ref 38–126)
Anion gap: 9 (ref 5–15)
BUN: 10 mg/dL (ref 6–20)
CO2: 20 mmol/L — ABNORMAL LOW (ref 22–32)
Calcium: 8.5 mg/dL — ABNORMAL LOW (ref 8.9–10.3)
Chloride: 107 mmol/L (ref 98–111)
Creatinine, Ser: 0.6 mg/dL (ref 0.44–1.00)
GFR, Estimated: >60 mL/min (ref >60)
Glucose, Bld: 97 mg/dL (ref 70–99)
Potassium: 3.8 mmol/L (ref 3.5–5.1)
Sodium: 136 mmol/L (ref 135–145)
Total Bilirubin: 0.7 mg/dL (ref 0.3–1.2)
Total Protein: 6.3 g/dL — ABNORMAL LOW (ref 6.5–8.1)

## 2020-03-11 LAB — CHLAMYDIA/NGC RT PCR (ARMC ONLY)
Chlamydia Tr: NOT DETECTED
N gonorrhoeae: NOT DETECTED

## 2020-03-11 LAB — URINALYSIS, ROUTINE W REFLEX MICROSCOPIC
Bilirubin Urine: NEGATIVE
Glucose, UA: NEGATIVE mg/dL
Hgb urine dipstick: NEGATIVE
Ketones, ur: 5 mg/dL — AB
Nitrite: NEGATIVE
Protein, ur: 100 mg/dL — AB
Specific Gravity, Urine: 1.019 (ref 1.005–1.030)
pH: 5 (ref 5.0–8.0)

## 2020-03-11 LAB — TYPE AND SCREEN
ABO/RH(D): O POS
Antibody Screen: NEGATIVE

## 2020-03-11 LAB — PROTEIN / CREATININE RATIO, URINE
Creatinine, Urine: 128 mg/dL
Protein Creatinine Ratio: 0.59 mg/mg{Cre} — ABNORMAL HIGH (ref 0.00–0.15)
Total Protein, Urine: 75 mg/dL

## 2020-03-11 LAB — ABO/RH: ABO/RH(D): O POS

## 2020-03-11 NOTE — Discharge Summary (Signed)
See Final Progress Note 

## 2020-03-11 NOTE — Final Progress Note (Addendum)
Physician Final Progress Note  Patient ID: Sarah Oneill MRN: [id number] DOB/AGE: Apr 27, 2000 19 y.o.  Admit date: 03/10/2020 Admitting provider: Conard Novak, MD Discharge date: 03/11/2020   Admission Diagnoses: Pregnancy, [redacted] weeks gestation, obesity affecting pregnancy  Discharge Diagnoses:  Active Problems:   Encounter for supervision of normal first pregnancy in third trimester   Obesity affecting pregnancy   [redacted] weeks gestation of pregnancy   Pelvic pain affecting pregnancy in third trimester, antepartum   Elevated blood pressure affecting pregnancy in third trimester, antepartum   History of Present Illness: The patient is a 19 y.o. female G1P0000 at [redacted]w[redacted]d who presents for back/pelvic pain. Patient has been experiencing mid-lower back pain that radiates to pelvis and vagina for several days - which intensified today to a rating of 5/10. Patient states that pain is sharp, has been ongoing but intermittently intensifies. Patient denies contractions, dysuria, increased frequency of urination. Patient notes that urinary has been "cloudy." Denies vaginal discharge. Reports regular fetal movement.   Past Medical History:  Diagnosis Date  . Anemia   . Asthma   . Vitamin D deficiency 04/19/2014    Past Surgical History:  Procedure Laterality Date  . HERNIA REPAIR     [facility]  . TONSILLECTOMY AND ADENOIDECTOMY N/A 08/30/2014   Procedure: TONSILLECTOMY AND ADENOIDECTOMY;  Surgeon: Bud Face, MD;  [facility]: [facility];  Service: ENT;  Laterality: N/A;  adenoids cauterized no tissue sent to lab    No current facility-administered medications on file prior to encounter.   Current Outpatient Medications on File Prior to Encounter  Medication Sig Dispense Refill  . albuterol (PROAIR HFA) 108 (90 Base) MCG/ACT inhaler Inhale 2 puffs into the lungs every 4 (four) hours as needed.     Marland Kitchen EPINEPHrine 0.3 mg/0.3 mL IJ SOAJ injection Inject 0.3 mg into the muscle as needed.  For Bee stings     . Prenat w/o A Vit-FeFum-FePo-FA (CONCEPT OB) 130-92.4-1 MG CAPS Take 1 capsule by mouth daily. 30 capsule 11  . Prenatal Vit-Fe Fumarate-FA (MULTIVITAMIN-PRENATAL) 27-0.8 MG TABS tablet Take 1 tablet by mouth daily at 12 noon.    . valACYclovir (VALTREX) 500 MG tablet Take 1 tablet (500 mg total) by mouth 2 (two) times daily. 60 tablet 1    Allergies  Allergen Reactions  . Augmentin [Amoxicillin-Pot Clavulanate] Hives  . Bee Venom Anaphylaxis    "Uses and Epi Pen for this." Per mother  . Shellfish Allergy Anaphylaxis, Hives and Other (See Comments)    Social History   Socioeconomic History  . Marital status: Single    Spouse name: Not on file  . Number of children: Not on file  . Years of education: Not on file  . Highest education level: Not on file  Occupational History  . Not on file  Tobacco Use  . Smoking status: Passive Smoke Exposure - Never Smoker  . Smokeless tobacco: Never Used  Vaping Use  . Vaping Use: Never used  Substance and Sexual Activity  . Alcohol use: No  . Drug use: No  . Sexual activity: Yes    Partners: Male    Comment: nexplanon  Other Topics Concern  . Not on file  Social History Narrative   Tressie attends 12th grade at [employer]. She is doing well.  She was retained once in 1 st grade.   Lives with her father. She enjoys playing volley ball, cheer and sleep   Social Determinants of Health   Financial Resource Strain:   .  Difficulty of Paying Living Expenses: Not on file  Food Insecurity:   . Worried About Programme researcher, broadcasting/film/video in the Last Year: Not on file  . Ran Out of Food in the Last Year: Not on file  Transportation Needs:   . Lack of Transportation (Medical): Not on file  . Lack of Transportation (Non-Medical): Not on file  Physical Activity:   . Days of Exercise per Week: Not on file  . Minutes of Exercise per Session: Not on file  Stress:   . Feeling of Stress : Not on file  Social Connections:   . Frequency  of Communication with Friends and Family: Not on file  . Frequency of Social Gatherings with Friends and Family: Not on file  . Attends Religious Services: Not on file  . Active Member of Clubs or Organizations: Not on file  . Attends Banker Meetings: Not on file  . Marital Status: Not on file  Intimate Partner Violence:   . Fear of Current or Ex-Partner: Not on file  . Emotionally Abused: Not on file  . Physically Abused: Not on file  . Sexually Abused: Not on file    Family History  Problem Relation Age of Onset  . Seizures Mother   . Bipolar disorder Mother   . ADD / ADHD Mother   . Anxiety disorder Mother   . Migraines Mother   . Migraines Father   . Bipolar disorder Sister   . ADD / ADHD Sister   . Migraines Sister   . Depression Maternal Grandmother   . Migraines Maternal Grandmother      Review of Systems  Constitutional: Negative for chills and fever.  HENT: Negative.   Eyes: Negative for blurred vision, double vision and photophobia.  Respiratory: Negative for cough and shortness of breath.   Cardiovascular: Negative for chest pain, palpitations and leg swelling.  Gastrointestinal: Negative for abdominal pain, diarrhea, heartburn, nausea and vomiting.  Genitourinary: Negative for dysuria, flank pain, frequency and urgency.  Musculoskeletal: Negative.   Skin: Negative.   Neurological: Negative for dizziness, tingling, loss of consciousness, weakness and headaches.  Endo/Heme/Allergies: Negative.   Psychiatric/Behavioral: Negative.      Physical Exam: BP 129/77   Pulse 97   Temp 98.5 F (36.9 C) (Oral)   Resp 18   LMP 06/06/2019 (Approximate)    Constitutional: Well nourished, well developed female in no acute distress.  HEENT: normal Skin: Warm and dry.  Cardiovascular: Regular rate and rhythm.   Extremity: reflexes +2, -clonus  Respiratory: Clear to auscultation bilateral. Normal respiratory effort Abdomen: fundus soft, nontender 39  weeks size and FHT present Back: no CVAT Neuro: DTRs 2+, Cranial nerves grossly intact Psych: Alert and Oriented x3. No memory deficits. Normal mood and affect.  MS: normal gait, normal bilateral lower extremity ROM/strength/stability.  Pelvic exam: (female chaperone present) is not limited by body habitus EGBUS: within normal limits Vagina: within normal limits and with normal mucosa, wet prep done, thin, white discharge, no blood in the vault Cervix: <0.5 cm   Consults: None  Significant Findings/ Diagnostic Studies: labs: CBC/CMP wnl, P/C ratio elevated, wet mount/GC/TC negative  Procedures: NONSTRESS TEST  INDICATIONS: pregnancy-induced hypertension and rule out uterine contractions FHR baseline: 125, moderate variability, +accels, - decels RESULTS:  A NST procedure was performed with FHR monitoring and a normal baseline established, appropriate time of 20-40 minutes of evaluation, and accels >2 seen w 15x15 characteristics.  Results show a REACTIVE NST.   Hospital Course:  The patient was admitted to Labor and Delivery Triage for observation. Patient noted to have elevated blood pressure on admission, 140s/90s for first twenty minutes on unit. Patient stated she "felt anxious" and pressures are usually "high in the hospital." Back/pelvic discomfort improved with tylenol administration. Patient denies further discomfort at this time. P/C ratio evaluated.  Serial blood pressures over the course of seven hours remained wnl. Plan to discharge home with follow-up tomorrow - call office to schedule BP check or return to L&D for BP check.   Discharge Condition: stable  Disposition: Discharge disposition: 01-Home or Self Care      Diet: Regular diet  Discharge Activity: Activity as tolerated   Allergies as of 03/11/2020      Reactions   Augmentin [amoxicillin-pot Clavulanate] Hives   Bee Venom Anaphylaxis   "Uses and Epi Pen for this." Per mother   Shellfish Allergy Anaphylaxis,  Hives, Other (See Comments)      Medication List    TAKE these medications   Concept OB 130-92.4-1 MG Caps Take 1 capsule by mouth daily.   EPINEPHrine 0.3 mg/0.3 mL Soaj injection Commonly known as: EPI-PEN Inject 0.3 mg into the muscle as needed. For Bee stings   multivitamin-prenatal 27-0.8 MG Tabs tablet Take 1 tablet by mouth daily at 12 noon.   ProAir HFA 108 (90 Base) MCG/ACT inhaler Generic drug: albuterol Inhale 2 puffs into the lungs every 4 (four) hours as needed.   valACYclovir 500 MG tablet Commonly known as: VALTREX Take 1 tablet (500 mg total) by mouth 2 (two) times daily.       Follow-up Information    [facility]. Call on 03/12/2020.   Specialty: Obstetrics and Gynecology Why: Call for BP check on Monday - if unable to schedule - return to L&D for BP check Contact information: [address] [place] [id number] [phone]       [facility] [place] .   Contact information: [address] [place] [address] [phone]              RTC for BP check on Monday at office of L&D. On-going evaluation for PIH. Reviewed S&S of preE including dizziness, blurred vision, headache, pain in RUQ.   SignedZipporah Plants, CNM 03/11/2020, 5:06 AM

## 2020-03-12 ENCOUNTER — Ambulatory Visit (INDEPENDENT_AMBULATORY_CARE_PROVIDER_SITE_OTHER): Payer: Medicaid Other | Admitting: Obstetrics and Gynecology

## 2020-03-12 ENCOUNTER — Other Ambulatory Visit: Payer: Self-pay

## 2020-03-12 VITALS — BP 118/72 | Ht 63.0 in | Wt 225.4 lb

## 2020-03-12 DIAGNOSIS — Z3403 Encounter for supervision of normal first pregnancy, third trimester: Secondary | ICD-10-CM

## 2020-03-12 DIAGNOSIS — Z3A39 39 weeks gestation of pregnancy: Secondary | ICD-10-CM

## 2020-03-12 DIAGNOSIS — O99213 Obesity complicating pregnancy, third trimester: Secondary | ICD-10-CM

## 2020-03-12 LAB — URINE CULTURE: Special Requests: NORMAL

## 2020-03-12 NOTE — Progress Notes (Signed)
Routine Prenatal Care Visit  Subjective  Sarah Oneill is a 19 y.o. G1P0000 at [redacted]w[redacted]d being seen today for ongoing prenatal care.  She is currently monitored for the following issues for this low-risk pregnancy and has Anxiety state; ADHD (attention deficit hyperactivity disorder); Astigmatism; Bee sting allergy; BMI (body mass index), pediatric, greater than or equal to 95% for age; Chronic constipation; Insulin resistance; Recurrent UTI; Shellfish allergy; Encounter for supervision of normal first pregnancy in third trimester; HSV (herpes simplex virus) anogenital infection; Obesity affecting pregnancy; [redacted] weeks gestation of pregnancy; Pelvic pain affecting pregnancy in third trimester, antepartum; and Elevated blood pressure affecting pregnancy in third trimester, antepartum on their problem list.  ----------------------------------------------------------------------------------- Patient reports no complaints.   Contractions: Not present. Vag. Bleeding: Scant.  Movement: Present. Denies leaking of fluid.  ----------------------------------------------------------------------------------- The following portions of the patient's history were reviewed and updated as appropriate: allergies, current medications, past family history, past medical history, past social history, past surgical history and problem list. Problem list updated.   Objective  Blood pressure 118/72, height 5\' 3"  (1.6 m), weight 225 lb 6.4 oz (102.2 kg), last menstrual period 06/06/2019. Pregravid weight 172 lb (78 kg) Total Weight Gain 53 lb 6.4 oz (24.2 kg) Urinalysis:      Fetal Status: Fetal Heart Rate (bpm): 140 Fundal Height: 40 cm Movement: Present     General:  Alert, oriented and cooperative. Patient is in no acute distress.  Skin: Skin is warm and dry. No rash noted.   Cardiovascular: Normal heart rate noted  Respiratory: Normal respiratory effort, no problems with respiration noted  Abdomen: Soft, gravid,  appropriate for gestational age. Pain/Pressure: Present     Pelvic:  Cervical exam deferred        Extremities: Normal range of motion.     Mental Status: Normal mood and affect. Normal behavior. Normal judgment and thought content.     Assessment   19 y.o. G1P0000 at [redacted]w[redacted]d by  03/18/2020, by Ultrasound presenting for routine prenatal visit  Plan   Pregnancy #1 Problems (from 08/02/19 to present)    Problem Noted Resolved   Elevated blood pressure affecting pregnancy in third trimester, antepartum 03/11/2020 by 03/13/2020, CNM No   [redacted] weeks gestation of pregnancy 03/10/2020 by 03/12/2020, CNM No   Pelvic pain affecting pregnancy in third trimester, antepartum 03/10/2020 by 03/12/2020, CNM No   Obesity affecting pregnancy 03/07/2020 by 03/09/2020, MD No   HSV (herpes simplex virus) anogenital infection 10/10/2019 by 10/12/2019, CNM No   Encounter for supervision of normal first pregnancy in third trimester 08/03/2019 by 08/05/2019, CNM No   Overview Addendum 02/22/2020  3:31 PM by 13/06/2019, MD    [facility] Prenatal Labs  Dating  9 wk Nadara Mustard Blood type: O/Positive/-- (05/11 1023)   Genetic Screen  NIPS: normal XY Antibody:Negative (05/11 1023)  Anatomic 07/11 [facility] nml Rubella: 2.44 (05/11 1023)  Varicella: Immune  GTT 8/31 RPR: Non Reactive (08/31 1032)   Rhogam Not needed HBsAg: Negative (05/11 1023)   Vaccines TDAP:  9/14                  Flu Shot:declines HIV: Non Reactive (08/31 1032)   Baby Food Breast                               06-02-1976-- (10/26 1542)  Contraception Nexplanon- may desire immediate postpartum  delivery Pap: < 21 y.o.  CBB  No  Valtrex @ 36 weeks [ x]   CS/VBAC NA HgbE: normal  Support Person [name]    Pregnancy Diagnoses:  HSV infection during pregnancy- given RX Chlamydia infection during pregnancy       Previous Version       Patient reports she is feeling very anxious regarding pregnancy and delivery. She does  not believe she can wait until Friday of this week to be induced. She feels too nervouse. Will move up induction to tomorrow.    Gestational age appropriate obstetric precautions including but not limited to vaginal bleeding, contractions, leaking of fluid and fetal movement were reviewed in detail with the patient.    No follow-ups on file.  Natale Milch MD [facility], [facility] 03/12/2020, 3:28 PM

## 2020-03-12 NOTE — Patient Instructions (Addendum)
Labor Induction  Labor induction is when steps are taken to cause a pregnant woman to begin the labor process. Most women go into labor on their own between 37 weeks and 42 weeks of pregnancy. When this does not happen or when there is a medical need for labor to begin, steps may be taken to induce labor. Labor induction causes a pregnant woman's uterus to contract. It also causes the cervix to soften (ripen), open (dilate), and thin out (efface). Usually, labor is not induced before 39 weeks of pregnancy unless there is a medical reason to do so. Your health care provider will determine if labor induction is needed. Before inducing labor, your health care provider will consider a number of factors, including:  Your medical condition and your baby's.  How many weeks along you are in your pregnancy.  How mature your baby's lungs are.  The condition of your cervix.  The position of your baby.  The size of your birth canal. What are some reasons for labor induction? Labor may be induced if:  Your health or your baby's health is at risk.  Your pregnancy is overdue by 1 week or more.  Your water breaks but labor does not start on its own.  There is a low amount of amniotic fluid around your baby. You may also choose (elect) to have labor induced at a certain time. Generally, elective labor induction is done no earlier than 39 weeks of pregnancy. What methods are used for labor induction? Methods used for labor induction include:  Prostaglandin medicine. This medicine starts contractions and causes the cervix to dilate and ripen. It can be taken by mouth (orally) or by being inserted into the vagina (suppository).  Inserting a small, thin tube (catheter) with a balloon into the vagina and then expanding the balloon with water to dilate the cervix.  Stripping the membranes. In this method, your health care provider gently separates amniotic sac tissue from the cervix. This causes the  cervix to stretch, which in turn causes the release of a hormone called progesterone. The hormone causes the uterus to contract. This procedure is often done during an office visit, after which you will be sent home to wait for contractions to begin.  Breaking the water. In this method, your health care provider uses a small instrument to make a small hole in the amniotic sac. This eventually causes the amniotic sac to break. Contractions should begin after a few hours.  Medicine to trigger or strengthen contractions. This medicine is given through an IV that is inserted into a vein in your arm. Except for membrane stripping, which can be done in a clinic, labor induction is done in the hospital so that you and your baby can be carefully monitored. How long does it take for labor to be induced? The length of time it takes to induce labor depends on how ready your body is for labor. Some inductions can take up to 2-3 days, while others may take less than a day. Induction may take longer if:  You are induced early in your pregnancy.  It is your first pregnancy.  Your cervix is not ready. What are some risks associated with labor induction? Some risks associated with labor induction include:  Changes in fetal heart rate, such as being too high, too low, or irregular (erratic).  Failed induction.  Infection in the mother or the baby.  Increased risk of having a cesarean delivery.  Fetal death.  Breaking off (abruption)   of the placenta from the uterus (rare).  Rupture of the uterus (very rare). When induction is needed for medical reasons, the benefits of induction generally outweigh the risks. What are some reasons for not inducing labor? Labor induction should not be done if:  Your baby does not tolerate contractions.  You have had previous surgeries on your uterus, such as a myomectomy, removal of fibroids, or a vertical scar from a previous cesarean delivery.  Your placenta lies  very low in your uterus and blocks the opening of the cervix (placenta previa).  Your baby is not in a head-down position.  The umbilical cord drops down into the birth canal in front of the baby.  There are unusual circumstances, such as the baby being very early (premature).  You have had more than 2 previous cesarean deliveries. Summary  Labor induction is when steps are taken to cause a pregnant woman to begin the labor process.  Labor induction causes a pregnant woman's uterus to contract. It also causes the cervix to ripen, dilate, and efface.  Labor is not induced before 39 weeks of pregnancy unless there is a medical reason to do so.  When induction is needed for medical reasons, the benefits of induction generally outweigh the risks. This information is not intended to replace advice given to you by your health care provider. Make sure you discuss any questions you have with your health care provider. Document Revised: 04/10/2017 Document Reviewed: 05/21/2016 Elsevier Patient Education  2020 [employer]   Vaginal Delivery  Vaginal delivery means that you give birth by pushing your baby out of your birth canal (vagina). A team of health care providers will help you before, during, and after vaginal delivery. Birth experiences are unique for every woman and every pregnancy, and birth experiences vary depending on where you choose to give birth. What happens when I arrive at the birth center or hospital? Once you are in labor and have been admitted into the hospital or birth center, your health care provider may:  Review your pregnancy history and any concerns that you have.  Insert an IV into one of your veins. This may be used to give you fluids and medicines.  Check your blood pressure, pulse, temperature, and heart rate (vital signs).  Check whether your bag of water (amniotic sac) has broken (ruptured).  Talk with you about your birth plan and discuss pain control  options. Monitoring Your health care provider may monitor your contractions (uterine monitoring) and your baby's heart rate (fetal monitoring). You may need to be monitored:  Often, but not continuously (intermittently).  All the time or for long periods at a time (continuously). Continuous monitoring may be needed if: ? You are taking certain medicines, such as medicine to relieve pain or make your contractions stronger. ? You have pregnancy or labor complications. Monitoring may be done by:  Placing a special stethoscope or a handheld monitoring device on your abdomen to check your baby's heartbeat and to check for contractions.  Placing monitors on your abdomen (external monitors) to record your baby's heartbeat and the frequency and length of contractions.  Placing monitors inside your uterus through your vagina (internal monitors) to record your baby's heartbeat and the frequency, length, and strength of your contractions. Depending on the type of monitor, it may remain in your uterus or on your baby's head until birth.  Telemetry. This is a type of continuous monitoring that can be done with external or internal monitors. Instead of   having to stay in bed, you are able to move around during telemetry. Physical exam Your health care provider may perform frequent physical exams. This may include:  Checking how and where your baby is positioned in your uterus.  Checking your cervix to determine: ? Whether it is thinning out (effacing). ? Whether it is opening up (dilating). What happens during labor and delivery?  Normal labor and delivery is divided into the following three stages: Stage 1  This is the longest stage of labor.  This stage can last for hours or days.  Throughout this stage, you will feel contractions. Contractions generally feel mild, infrequent, and irregular at first. They get stronger, more frequent (about every 2-3 minutes), and more regular as you move  through this stage.  This stage ends when your cervix is completely dilated to 4 inches (10 cm) and completely effaced. Stage 2  This stage starts once your cervix is completely effaced and dilated and lasts until the delivery of your baby.  This stage may last from 20 minutes to 2 hours.  This is the stage where you will feel an urge to push your baby out of your vagina.  You may feel stretching and burning pain, especially when the widest part of your baby's head passes through the vaginal opening (crowning).  Once your baby is delivered, the umbilical cord will be clamped and cut. This usually occurs after waiting a period of 1-2 minutes after delivery.  Your baby will be placed on your bare chest (skin-to-skin contact) in an upright position and covered with a warm blanket. Watch your baby for feeding cues, like rooting or sucking, and help the baby to your breast for his or her first feeding. Stage 3  This stage starts immediately after the birth of your baby and ends after you deliver the placenta.  This stage may take anywhere from 5 to 30 minutes.  After your baby has been delivered, you will feel contractions as your body expels the placenta and your uterus contracts to control bleeding. What can I expect after labor and delivery?  After labor is over, you and your baby will be monitored closely until you are ready to go home to ensure that you are both healthy. Your health care team will teach you how to care for yourself and your baby.  You and your baby will stay in the same room (rooming in) during your hospital stay. This will encourage early bonding and successful breastfeeding.  You may continue to receive fluids and medicines through an IV.  Your uterus will be checked and massaged regularly (fundal massage).  You will have some soreness and pain in your abdomen, vagina, and the area of skin between your vaginal opening and your anus (perineum).  If an incision was  made near your vagina (episiotomy) or if you had some vaginal tearing during delivery, cold compresses may be placed on your episiotomy or your tear. This helps to reduce pain and swelling.  You may be given a squirt bottle to use instead of wiping when you go to the bathroom. To use the squirt bottle, follow these steps: ? Before you urinate, fill the squirt bottle with warm water. Do not use hot water. ? After you urinate, while you are sitting on the toilet, use the squirt bottle to rinse the area around your urethra and vaginal opening. This rinses away any urine and blood. ? Fill the squirt bottle with clean water every time you use the   bathroom.  It is normal to have vaginal bleeding after delivery. Wear a sanitary pad for vaginal bleeding and discharge. Summary  Vaginal delivery means that you will give birth by pushing your baby out of your birth canal (vagina).  Your health care provider may monitor your contractions (uterine monitoring) and your baby's heart rate (fetal monitoring).  Your health care provider may perform a physical exam.  Normal labor and delivery is divided into three stages.  After labor is over, you and your baby will be monitored closely until you are ready to go home. This information is not intended to replace advice given to you by your health care provider. Make sure you discuss any questions you have with your health care provider. Document Revised: 05/12/2017 Document Reviewed: 05/12/2017 Elsevier Patient Education  2020 Else[employer] Inc.   Pain Relief During Labor and Delivery Many things can cause pain during labor and delivery, including:  Pressure on bones and ligaments due to the baby moving through the pelvis.  Stretching of tissues due to the baby moving through the birth canal.  Muscle tension due to anxiety or nervousness.  The uterus tightening (contracting) and relaxing to help move the baby. There are many ways to deal with the pain of  labor and delivery. They include:  Taking prenatal classes. Taking these classes helps you know what to expect during your baby's birth. What you learn will increase your confidence and decrease your anxiety.  Practicing relaxation techniques or doing relaxing activities, such as: ? Focused breathing. ? Meditation. ? Visualization. ? Aroma therapy. ? Listening to your favorite music. ? Hypnosis.  Taking a warm shower or bath (hydrotherapy). This may: ? Provide comfort and relaxation. ? Lessen your perception of pain. ? Decrease the amount of pain medicine needed. ? Decrease the length of labor.  Getting a massage or counterpressure on your back.  Applying warm packs or ice packs.  Changing positions often, moving around, or using a birthing ball.  Getting: ? Pain medicine through an IV or injection into a muscle. ? Pain medicine inserted into your spinal column. ? Injections of sterile water just under the skin on your lower back (intradermal injections). ? Laughing gas (nitrous oxide). Discuss your pain control options with your health care provider during your prenatal visits. Explore the options offered by your hospital or birth center. What kinds of medicine are available? There are two kinds of medicines that can be used to relieve pain during labor and delivery:  Analgesics. These medicines decrease pain without causing you to lose feeling or the ability to move your muscles.  Anesthetics. These medicines block feeling in the body and can decrease your ability to move freely. Both of these kinds of medicine can cause minor side effects, such as nausea, trouble concentrating, and sleepiness. They can also decrease the baby's heart rate before birth and affect the baby's breathing rate after birth. For this reason, health care providers are careful about when and how much medicine is given. What are specific medicines and procedures that provide pain relief? Local  Anesthetics Local anesthetics are used to numb a small area of the body. They may be used along with another kind of anesthetic or used to numb the nerves of the vagina, cervix, and perineum during the second stage of labor. General Anesthetics General anesthetics cause you to lose consciousness so you do not feel pain. They are usually only used for an emergency cesarean delivery. General anesthetics are given through an IV   tube and a mask. Pudendal Block A pudendal block is a form of local anesthetic. It may be used to relieve the pain associated with pushing or stretching of the perineum at the time of delivery or to further numb the perineum. A pudendal block is done by injecting numbing medicine through the vaginal wall into a nerve in the pelvis. Epidural Analgesia Epidural analgesia is given through a flexible IV catheter that is inserted into the lower back. Numbing medicine is delivered continuously to the area near your spinal column nerves (epidural space). After having this type of analgesia, you may be able to move your legs but you most likely will not be able to walk. Depending on the amount of medicine given, you may lose all feeling in the lower half of your body, or you may retain some level of sensation, including the urge to push. Epidural analgesia can be used to provide pain relief for a vaginal birth. Spinal Block A spinal block is similar to epidural analgesia, but the medicine is injected into the spinal fluid instead of the epidural space. A spinal block is only given once. It starts to relieve pain quickly, but the pain relief lasts only 1-6 hours. Spinal blocks can be used for cesarean deliveries. Combined Spinal-Epidural (CSE) Block A CSE block combines the effects of a spinal block and epidural analgesia. The spinal block works quickly to block all pain. The epidural analgesia provides continuous pain relief, even after the effects of the spinal block have worn off. This  information is not intended to replace advice given to you by your health care provider. Make sure you discuss any questions you have with your health care provider. Document Revised: 03/20/2017 Document Reviewed: 08/29/2015 Elsevier Patient Education  2020 Elsevier Inc.   

## 2020-03-12 NOTE — Progress Notes (Signed)
Pt states she has been bleeding. Pt states she's not covering a pad. Pt states she just saw some white milky looking discharge I her panties. It maybe her (mucus plug )

## 2020-03-13 ENCOUNTER — Inpatient Hospital Stay
Admission: EM | Admit: 2020-03-13 | Discharge: 2020-03-15 | DRG: 787 | Disposition: A | Discharge status: Home / Self Care | Payer: Medicaid Other | Attending: Obstetrics & Gynecology | Admitting: Obstetrics & Gynecology

## 2020-03-13 ENCOUNTER — Encounter: Payer: Self-pay | Admitting: Obstetrics and Gynecology

## 2020-03-13 ENCOUNTER — Inpatient Hospital Stay: Payer: Medicaid Other

## 2020-03-13 ENCOUNTER — Encounter
Admission: EM | Disposition: A | Discharge status: Home / Self Care | Payer: Self-pay | Source: Home / Self Care | Attending: Obstetrics & Gynecology

## 2020-03-13 ENCOUNTER — Other Ambulatory Visit: Payer: Self-pay

## 2020-03-13 ENCOUNTER — Inpatient Hospital Stay: Payer: Medicaid Other | Admitting: Anesthesiology

## 2020-03-13 DIAGNOSIS — Z98891 History of uterine scar from previous surgery: Secondary | ICD-10-CM

## 2020-03-13 DIAGNOSIS — D62 Acute posthemorrhagic anemia: Secondary | ICD-10-CM | POA: Diagnosis not present

## 2020-03-13 DIAGNOSIS — O99213 Obesity complicating pregnancy, third trimester: Secondary | ICD-10-CM | POA: Diagnosis not present

## 2020-03-13 DIAGNOSIS — Z3403 Encounter for supervision of normal first pregnancy, third trimester: Secondary | ICD-10-CM

## 2020-03-13 DIAGNOSIS — O99210 Obesity complicating pregnancy, unspecified trimester: Secondary | ICD-10-CM | POA: Diagnosis present

## 2020-03-13 DIAGNOSIS — Z3490 Encounter for supervision of normal pregnancy, unspecified, unspecified trimester: Secondary | ICD-10-CM | POA: Diagnosis present

## 2020-03-13 DIAGNOSIS — O99824 Streptococcus B carrier state complicating childbirth: Secondary | ICD-10-CM | POA: Diagnosis present

## 2020-03-13 DIAGNOSIS — O9832 Other infections with a predominantly sexual mode of transmission complicating childbirth: Secondary | ICD-10-CM | POA: Diagnosis present

## 2020-03-13 DIAGNOSIS — O9952 Diseases of the respiratory system complicating childbirth: Secondary | ICD-10-CM | POA: Diagnosis present

## 2020-03-13 DIAGNOSIS — O99214 Obesity complicating childbirth: Secondary | ICD-10-CM | POA: Diagnosis present

## 2020-03-13 DIAGNOSIS — J45909 Unspecified asthma, uncomplicated: Secondary | ICD-10-CM | POA: Diagnosis present

## 2020-03-13 DIAGNOSIS — O99892 Other specified diseases and conditions complicating childbirth: Secondary | ICD-10-CM

## 2020-03-13 DIAGNOSIS — O163 Unspecified maternal hypertension, third trimester: Secondary | ICD-10-CM | POA: Diagnosis not present

## 2020-03-13 DIAGNOSIS — O9081 Anemia of the puerperium: Secondary | ICD-10-CM | POA: Diagnosis not present

## 2020-03-13 DIAGNOSIS — A6000 Herpesviral infection of urogenital system, unspecified: Secondary | ICD-10-CM | POA: Diagnosis present

## 2020-03-13 DIAGNOSIS — O26893 Other specified pregnancy related conditions, third trimester: Secondary | ICD-10-CM | POA: Diagnosis present

## 2020-03-13 DIAGNOSIS — A609 Anogenital herpesviral infection, unspecified: Secondary | ICD-10-CM

## 2020-03-13 DIAGNOSIS — Z20822 Contact with and (suspected) exposure to covid-19: Secondary | ICD-10-CM | POA: Diagnosis present

## 2020-03-13 DIAGNOSIS — Z3A38 38 weeks gestation of pregnancy: Secondary | ICD-10-CM

## 2020-03-13 DIAGNOSIS — Z3A39 39 weeks gestation of pregnancy: Secondary | ICD-10-CM

## 2020-03-13 DIAGNOSIS — R102 Pelvic and perineal pain: Secondary | ICD-10-CM

## 2020-03-13 HISTORY — DX: Anxiety disorder, unspecified: F41.9

## 2020-03-13 LAB — CBC
HCT: 32.8 % — ABNORMAL LOW (ref 36.0–46.0)
Hemoglobin: 10.7 g/dL — ABNORMAL LOW (ref 12.0–15.0)
MCH: 27.8 pg (ref 26.0–34.0)
MCHC: 32.6 g/dL (ref 30.0–36.0)
MCV: 85.2 fL (ref 80.0–100.0)
Platelets: 181 10*3/uL (ref 150–400)
RBC: 3.85 MIL/uL — ABNORMAL LOW (ref 3.87–5.11)
RDW: 16.1 % — ABNORMAL HIGH (ref 11.5–15.5)
WBC: 8.2 10*3/uL (ref 4.0–10.5)
nRBC: 0 % (ref 0.0–0.2)

## 2020-03-13 LAB — RESP PANEL BY RT-PCR (FLU A&B, COVID) ARPGX2
Influenza A by PCR: NEGATIVE
Influenza B by PCR: NEGATIVE
SARS Coronavirus 2 by RT PCR: NEGATIVE

## 2020-03-13 LAB — PROTEIN / CREATININE RATIO, URINE
Creatinine, Urine: 67 mg/dL
Protein Creatinine Ratio: 0.22 mg/mg{Cre} — ABNORMAL HIGH (ref 0.00–0.15)
Total Protein, Urine: 15 mg/dL

## 2020-03-13 LAB — TYPE AND SCREEN
ABO/RH(D): O POS
Antibody Screen: NEGATIVE

## 2020-03-13 SURGERY — Surgical Case
Anesthesia: Epidural

## 2020-03-13 MED ORDER — MISOPROSTOL 200 MCG PO TABS
ORAL_TABLET | ORAL | Status: AC
  Filled 2020-03-13: qty 4

## 2020-03-13 MED ORDER — FENTANYL 2.5 MCG/ML W/ROPIVACAINE 0.15% IN NS 100 ML EPIDURAL (ARMC)
EPIDURAL | Status: AC
  Filled 2020-03-13: qty 100

## 2020-03-13 MED ORDER — SODIUM CHLORIDE 0.9% FLUSH: 3.0000 mL | INTRAVENOUS | Status: DC | PRN

## 2020-03-13 MED ORDER — PRENATAL MULTIVITAMIN CH
1.0000 | ORAL_TABLET | Freq: Every day | ORAL | Status: DC
  Filled 2020-03-13: qty 1

## 2020-03-13 MED ORDER — SIMETHICONE 80 MG PO CHEW
80.0000 mg | CHEWABLE_TABLET | ORAL | Status: DC
  Filled 2020-03-13: qty 1

## 2020-03-13 MED ORDER — OXYTOCIN-SODIUM CHLORIDE 30-0.9 UT/500ML-% IV SOLN
1.0000 m[IU]/min | INTRAVENOUS | Status: DC
  Administered 2020-03-13: 1 m[IU]/min via INTRAVENOUS

## 2020-03-13 MED ORDER — LIDOCAINE HCL (PF) 1 % IJ SOLN
INTRAMUSCULAR | Status: AC
  Filled 2020-03-13: qty 30

## 2020-03-13 MED ORDER — NALBUPHINE HCL 10 MG/ML IJ SOLN: 5.0000 mg | INTRAMUSCULAR | Status: DC | PRN

## 2020-03-13 MED ORDER — ONDANSETRON HCL 4 MG/2ML IJ SOLN: 4.0000 mg | Freq: Four times a day (QID) | INTRAMUSCULAR | Status: DC | PRN

## 2020-03-13 MED ORDER — OXYCODONE HCL 5 MG PO TABS: 5.0000 mg | ORAL_TABLET | ORAL | Status: AC | PRN

## 2020-03-13 MED ORDER — ACETAMINOPHEN 325 MG PO TABS
650.0000 mg | ORAL_TABLET | Freq: Four times a day (QID) | ORAL | Status: AC
  Administered 2020-03-14 (×3): 650 mg via ORAL
  Filled 2020-03-13 (×4): qty 2

## 2020-03-13 MED ORDER — PHENYLEPHRINE 40 MCG/ML (10ML) SYRINGE FOR IV PUSH (FOR BLOOD PRESSURE SUPPORT): 80.0000 ug | PREFILLED_SYRINGE | INTRAVENOUS | Status: DC | PRN

## 2020-03-13 MED ORDER — WITCH HAZEL-GLYCERIN EX PADS: 1.0000 "application " | MEDICATED_PAD | CUTANEOUS | Status: DC | PRN

## 2020-03-13 MED ORDER — LIDOCAINE HCL (PF) 2 % IJ SOLN
INTRAMUSCULAR | Status: AC
  Filled 2020-03-13: qty 15

## 2020-03-13 MED ORDER — NALBUPHINE HCL 10 MG/ML IJ SOLN: 5.0000 mg | Freq: Once | INTRAMUSCULAR | Status: DC | PRN

## 2020-03-13 MED ORDER — EPHEDRINE 5 MG/ML INJ: 10.0000 mg | INTRAVENOUS | Status: DC | PRN

## 2020-03-13 MED ORDER — CHLOROPROCAINE HCL (PF) 3 % IJ SOLN
INTRAMUSCULAR | Status: DC | PRN
  Administered 2020-03-13: 20 mL via EPIDURAL

## 2020-03-13 MED ORDER — SODIUM CHLORIDE 0.9 % IV SOLN: 2.0000 g | Freq: Four times a day (QID) | INTRAVENOUS | Status: DC

## 2020-03-13 MED ORDER — OXYTOCIN 10 UNIT/ML IJ SOLN
INTRAMUSCULAR | Status: AC
  Filled 2020-03-13: qty 2

## 2020-03-13 MED ORDER — COCONUT OIL OIL
1.0000 "application " | TOPICAL_OIL | Status: DC | PRN
  Administered 2020-03-14: 1 via TOPICAL
  Filled 2020-03-13: qty 120

## 2020-03-13 MED ORDER — OXYTOCIN-SODIUM CHLORIDE 30-0.9 UT/500ML-% IV SOLN
2.5000 [IU]/h | INTRAVENOUS | Status: DC
  Administered 2020-03-13: 2.5 [IU]/h via INTRAVENOUS
  Filled 2020-03-13: qty 1000

## 2020-03-13 MED ORDER — KETOROLAC TROMETHAMINE 30 MG/ML IJ SOLN: 30.0000 mg | Freq: Four times a day (QID) | INTRAMUSCULAR | Status: AC

## 2020-03-13 MED ORDER — PROPOFOL 10 MG/ML IV BOLUS
INTRAVENOUS | Status: AC
  Filled 2020-03-13: qty 20

## 2020-03-13 MED ORDER — DIBUCAINE (PERIANAL) 1 % EX OINT: 1.0000 "application " | TOPICAL_OINTMENT | CUTANEOUS | Status: DC | PRN

## 2020-03-13 MED ORDER — SOD CITRATE-CITRIC ACID 500-334 MG/5ML PO SOLN
30.0000 mL | ORAL | Status: DC | PRN
  Filled 2020-03-13: qty 15

## 2020-03-13 MED ORDER — ONDANSETRON HCL 4 MG/2ML IJ SOLN: 4.0000 mg | Freq: Three times a day (TID) | INTRAMUSCULAR | Status: DC | PRN

## 2020-03-13 MED ORDER — SENNOSIDES-DOCUSATE SODIUM 8.6-50 MG PO TABS
2.0000 | ORAL_TABLET | ORAL | Status: DC
  Filled 2020-03-13 (×2): qty 2

## 2020-03-13 MED ORDER — CEFAZOLIN SODIUM-DEXTROSE 1-4 GM/50ML-% IV SOLN
1.0000 g | Freq: Three times a day (TID) | INTRAVENOUS | Status: DC
  Administered 2020-03-13: 1 g via INTRAVENOUS
  Filled 2020-03-13: qty 50

## 2020-03-13 MED ORDER — ZOLPIDEM TARTRATE 5 MG PO TABS: 5.0000 mg | ORAL_TABLET | Freq: Every evening | ORAL | Status: DC | PRN

## 2020-03-13 MED ORDER — NALOXONE HCL 4 MG/10ML IJ SOLN
1.0000 ug/kg/h | INTRAVENOUS | Status: DC | PRN
  Filled 2020-03-13: qty 5

## 2020-03-13 MED ORDER — BUPIVACAINE 0.25 % ON-Q PUMP DUAL CATH 400 ML
400.0000 mL | INJECTION | Status: DC
  Filled 2020-03-13: qty 400

## 2020-03-13 MED ORDER — MISOPROSTOL 25 MCG QUARTER TABLET
25.0000 ug | ORAL_TABLET | ORAL | Status: DC | PRN
  Administered 2020-03-13: 25 ug via VAGINAL
  Filled 2020-03-13 (×2): qty 1

## 2020-03-13 MED ORDER — LIDOCAINE HCL (PF) 1 % IJ SOLN
INTRAMUSCULAR | Status: DC | PRN
  Administered 2020-03-13: 3 mL via SUBCUTANEOUS

## 2020-03-13 MED ORDER — LACTATED RINGERS IV SOLN: INTRAVENOUS | Status: DC

## 2020-03-13 MED ORDER — LACTATED RINGERS IV SOLN
500.0000 mL | Freq: Once | INTRAVENOUS | Status: AC
  Administered 2020-03-13: 500 mL via INTRAVENOUS

## 2020-03-13 MED ORDER — DIPHENHYDRAMINE HCL 50 MG/ML IJ SOLN: 12.5000 mg | INTRAMUSCULAR | Status: DC | PRN

## 2020-03-13 MED ORDER — MORPHINE SULFATE (PF) 0.5 MG/ML IJ SOLN
INTRAMUSCULAR | Status: AC
  Filled 2020-03-13: qty 10

## 2020-03-13 MED ORDER — LIDOCAINE HCL (PF) 1 % IJ SOLN: 30.0000 mL | INTRAMUSCULAR | Status: DC | PRN

## 2020-03-13 MED ORDER — TERBUTALINE SULFATE 1 MG/ML IJ SOLN: 0.2500 mg | Freq: Once | INTRAMUSCULAR | Status: DC | PRN

## 2020-03-13 MED ORDER — SIMETHICONE 80 MG PO CHEW
80.0000 mg | CHEWABLE_TABLET | Freq: Three times a day (TID) | ORAL | Status: DC
  Administered 2020-03-14 – 2020-03-15 (×4): 80 mg via ORAL
  Filled 2020-03-13 (×5): qty 1

## 2020-03-13 MED ORDER — BUPIVACAINE HCL (PF) 0.25 % IJ SOLN
INTRAMUSCULAR | Status: DC | PRN
  Administered 2020-03-13: 2 mL via EPIDURAL
  Administered 2020-03-13: 4 mL via EPIDURAL

## 2020-03-13 MED ORDER — OXYTOCIN 10 UNIT/ML IJ SOLN: 10.0000 [IU] | Freq: Once | INTRAMUSCULAR | Status: DC

## 2020-03-13 MED ORDER — DIPHENHYDRAMINE HCL 25 MG PO CAPS: 25.0000 mg | ORAL_CAPSULE | ORAL | Status: DC | PRN

## 2020-03-13 MED ORDER — SIMETHICONE 80 MG PO CHEW: 80.0000 mg | CHEWABLE_TABLET | ORAL | Status: DC | PRN

## 2020-03-13 MED ORDER — DIPHENHYDRAMINE HCL 25 MG PO CAPS: 25.0000 mg | ORAL_CAPSULE | Freq: Four times a day (QID) | ORAL | Status: DC | PRN

## 2020-03-13 MED ORDER — LIDOCAINE-EPINEPHRINE (PF) 1.5 %-1:200000 IJ SOLN
INTRAMUSCULAR | Status: DC | PRN
  Administered 2020-03-13: 3 mL via EPIDURAL

## 2020-03-13 MED ORDER — NALOXONE HCL 0.4 MG/ML IJ SOLN: 0.4000 mg | INTRAMUSCULAR | Status: DC | PRN

## 2020-03-13 MED ORDER — AMMONIA AROMATIC IN INHA
RESPIRATORY_TRACT | Status: AC
  Filled 2020-03-13: qty 10

## 2020-03-13 MED ORDER — FENTANYL 2.5 MCG/ML W/ROPIVACAINE 0.15% IN NS 100 ML EPIDURAL (ARMC)
12.0000 mL/h | EPIDURAL | Status: DC
  Administered 2020-03-13: 12 mL/h via EPIDURAL

## 2020-03-13 MED ORDER — BUPIVACAINE HCL (PF) 0.5 % IJ SOLN
INTRAMUSCULAR | Status: AC
  Filled 2020-03-13: qty 30

## 2020-03-13 MED ORDER — MENTHOL 3 MG MT LOZG
1.0000 | LOZENGE | OROMUCOSAL | Status: DC | PRN
  Filled 2020-03-13: qty 9

## 2020-03-13 MED ORDER — OXYTOCIN BOLUS FROM INFUSION: 333.0000 mL | Freq: Once | INTRAVENOUS | Status: DC

## 2020-03-13 MED ORDER — OXYTOCIN-SODIUM CHLORIDE 30-0.9 UT/500ML-% IV SOLN
2.5000 [IU]/h | INTRAVENOUS | Status: AC
  Administered 2020-03-13: 2.5 [IU]/h via INTRAVENOUS

## 2020-03-13 MED ORDER — BUTORPHANOL TARTRATE 1 MG/ML IJ SOLN
1.0000 mg | INTRAMUSCULAR | Status: DC | PRN
  Administered 2020-03-13: 1 mg via INTRAVENOUS
  Filled 2020-03-13: qty 1

## 2020-03-13 MED ORDER — CEFAZOLIN SODIUM-DEXTROSE 2-4 GM/100ML-% IV SOLN
2.0000 g | Freq: Once | INTRAVENOUS | Status: AC
  Administered 2020-03-13: 2 g via INTRAVENOUS
  Filled 2020-03-13: qty 100

## 2020-03-13 MED ORDER — LACTATED RINGERS IV SOLN
500.0000 mL | INTRAVENOUS | Status: DC | PRN
  Administered 2020-03-13: 500 mL via INTRAVENOUS

## 2020-03-13 MED ORDER — MORPHINE SULFATE (PF) 0.5 MG/ML IJ SOLN
INTRAMUSCULAR | Status: DC | PRN
  Administered 2020-03-13: 3 mg via EPIDURAL

## 2020-03-13 MED ORDER — LIDOCAINE HCL (PF) 2 % IJ SOLN
INTRAMUSCULAR | Status: DC | PRN
  Administered 2020-03-13 (×3): 100 mg via EPIDURAL

## 2020-03-13 MED ORDER — KETOROLAC TROMETHAMINE 30 MG/ML IJ SOLN
30.0000 mg | Freq: Four times a day (QID) | INTRAMUSCULAR | Status: AC
  Administered 2020-03-14 (×4): 30 mg via INTRAVENOUS
  Filled 2020-03-13 (×4): qty 1

## 2020-03-13 MED ORDER — BUPIVACAINE HCL (PF) 0.5 % IJ SOLN
INTRAMUSCULAR | Status: DC | PRN
  Administered 2020-03-13: 10 mL

## 2020-03-13 SURGICAL SUPPLY — 28 items
ADH SKN CLS APL DERMABOND .7 (GAUZE/BANDAGES/DRESSINGS) ×2
APL PRP STRL LF DISP 70% ISPRP (MISCELLANEOUS) ×2
CANISTER SUCT 3000ML PPV (MISCELLANEOUS) ×3 IMPLANT
CATH KIT ON-Q SILVERSOAK 5 (CATHETERS) ×2 IMPLANT
CATH KIT ON-Q SILVERSOAK 5IN (CATHETERS) ×9 IMPLANT
CHLORAPREP W/TINT 26 (MISCELLANEOUS) ×6 IMPLANT
COVER WAND RF STERILE (DRAPES) ×3 IMPLANT
DERMABOND ADVANCED (GAUZE/BANDAGES/DRESSINGS) ×4
DERMABOND ADVANCED .7 DNX12 (GAUZE/BANDAGES/DRESSINGS) ×1 IMPLANT
ELECT CAUTERY BLADE 6.4 (BLADE) ×3 IMPLANT
ELECT REM PT RETURN 9FT ADLT (ELECTROSURGICAL) ×3
ELECTRODE REM PT RTRN 9FT ADLT (ELECTROSURGICAL) ×1 IMPLANT
GLOVE SKINSENSE NS SZ8.0 LF (GLOVE) ×2
GLOVE SKINSENSE STRL SZ8.0 LF (GLOVE) ×1 IMPLANT
GOWN STRL REUS W/ TWL LRG LVL3 (GOWN DISPOSABLE) ×1 IMPLANT
GOWN STRL REUS W/ TWL XL LVL3 (GOWN DISPOSABLE) ×2 IMPLANT
GOWN STRL REUS W/TWL LRG LVL3 (GOWN DISPOSABLE) ×3
GOWN STRL REUS W/TWL XL LVL3 (GOWN DISPOSABLE) ×6
MANIFOLD NEPTUNE II (INSTRUMENTS) ×3 IMPLANT
NS IRRIG 1000ML POUR BTL (IV SOLUTION) ×3 IMPLANT
PACK C SECTION AR (MISCELLANEOUS) ×3 IMPLANT
PAD OB MATERNITY 4.3X12.25 (PERSONAL CARE ITEMS) ×3 IMPLANT
PAD PREP 24X41 OB/GYN DISP (PERSONAL CARE ITEMS) ×3 IMPLANT
PENCIL SMOKE ULTRAEVAC 22 CON (MISCELLANEOUS) ×3 IMPLANT
SUT MAXON ABS #0 GS21 30IN (SUTURE) ×6 IMPLANT
SUT VIC AB 1 CT1 36 (SUTURE) ×11 IMPLANT
SUT VIC AB 2-0 CT1 36 (SUTURE) ×3 IMPLANT
SUT VIC AB 4-0 FS2 27 (SUTURE) ×3 IMPLANT

## 2020-03-13 NOTE — H&P (Signed)
Obstetrics Admission History & Physical   CC: 39 weeks Induction  HPI:  19 y.o. G1P0000 @ [redacted]w[redacted]d (03/18/2020, by Ultrasound). Admitted on 03/13/2020:   Patient Active Problem List   Diagnosis Date Noted  . Encounter for elective induction of labor 03/13/2020  . Elevated blood pressure affecting pregnancy in third trimester, antepartum 03/11/2020  . [redacted] weeks gestation of pregnancy 03/10/2020  . Pelvic pain affecting pregnancy in third trimester, antepartum 03/10/2020  . Obesity affecting pregnancy 03/07/2020  . HSV (herpes simplex virus) anogenital infection 10/10/2019  . Encounter for supervision of normal first pregnancy in third trimester 08/03/2019  . ADHD (attention deficit hyperactivity disorder) 08/02/2019  . Bee sting allergy 08/02/2019  . Shellfish allergy 08/02/2019  . Recurrent UTI 01/29/2018  . Chronic constipation 10/20/2017  . Anxiety state 07/18/2015  . Astigmatism 08/20/2011  . BMI (body mass index), pediatric, greater than or equal to 95% for age 41/04/2011  . Insulin resistance 08/20/2011     Presents for labor induction at term; pt has been having irreg ctxs and pains.  No VB or ROM.Marland Kitchen   Prenatal care at: at [facility]. Pregnancy complicated by none.  ROS: A review of systems was performed and negative, except as stated in the above HPI. PMHx:  Past Medical History:  Diagnosis Date  . Anemia   . Anxiety   . Asthma   . Vitamin D deficiency 04/19/2014   PSHx:  Past Surgical History:  Procedure Laterality Date  . HERNIA REPAIR     [facility]  . TONSILLECTOMY AND ADENOIDECTOMY N/A 08/30/2014   Procedure: TONSILLECTOMY AND ADENOIDECTOMY;  Surgeon: Bud Face, MD;  Location: [facility];  Service: ENT;  Laterality: N/A;  adenoids cauterized no tissue sent to lab   Medications:  Medications Prior to Admission  Medication Sig Dispense Refill Last Dose  . albuterol (PROAIR HFA) 108 (90 Base) MCG/ACT inhaler Inhale 2 puffs into the lungs every 4 (four)  hours as needed.    Past Month at Unknown time  . Prenat w/o A Vit-FeFum-FePo-FA (CONCEPT OB) 130-92.4-1 MG CAPS Take 1 capsule by mouth daily. 30 capsule 11 03/12/2020 at Unknown time  . Prenatal Vit-Fe Fumarate-FA (MULTIVITAMIN-PRENATAL) 27-0.8 MG TABS tablet Take 1 tablet by mouth daily at 12 noon.   03/12/2020 at Unknown time  . EPINEPHrine 0.3 mg/0.3 mL IJ SOAJ injection Inject 0.3 mg into the muscle as needed. For Bee stings      . valACYclovir (VALTREX) 500 MG tablet Take 1 tablet (500 mg total) by mouth 2 (two) times daily. 60 tablet 1    Allergies: is allergic to augmentin [amoxicillin-pot clavulanate], bee venom, and shellfish allergy. OBHx:  OB History  Gravida Para Term Preterm AB Living  1 0 0 0 0 0  SAB TAB Ectopic Multiple Live Births  0 0 0 0 0    # Outcome Date GA Lbr Len/2nd Weight Sex Delivery Anes PTL Lv  1 Current            ZES:[id number] except as detailed in HPI.Marland Kitchen  No family history of birth defects. Soc Hx: Alcohol: none and Recreational drug use: none  Objective:   Vitals:   03/13/20 0833  BP: (!) 143/94  Pulse: 99  Resp: 17  Temp: 99.8 F (37.7 C)   Constitutional: Well nourished, well developed female in no acute distress.  HEENT: normal Skin: Warm and dry.  Cardiovascular:Regular rate and rhythm.   Extremity: trace to 1+ bilateral pedal edema Respiratory: Clear to auscultation bilateral. Normal respiratory  effort Abdomen: gravid, ND, FHT present, mild tenderness on exam Back: no CVAT Neuro: DTRs 2+, Cranial nerves grossly intact Psych: Alert and Oriented x3. No memory deficits. Normal mood and affect.  MS: normal gait, normal bilateral lower extremity ROM/strength/stability.  Pelvic exam: is not limited by body habitus EGBUS: within normal limits Vagina: within normal limits and with normal mucosa Cervix: EXTERNAL GENITALIA: normal appearing vulva with no masses, tenderness or lesions CERVIX: 1 cm dilated, 70 effaced, -2 station,  presenting part VTX VAGINA: no abnormalities noted MEMBRANES: intact Uterus: No contractions observed for 30 minutes.  Adnexa: not evaluated  EFM:FHR: 140 bpm, variability: moderate,  accelerations:  Present,  decelerations:  Absent Toco: None   Perinatal info:  Blood type: O positive Rubella- Immune Varicella -Immune TDaP Given during third trimester of this pregnancy RPR NR / HIV Neg/ HBsAg Neg   Assessment & Plan:   19 y.o. G1P0000 @ [redacted]w[redacted]d, Admitted on 03/13/2020:IOL at term    Admit for labor, Antibiotics for GBS prophylaxis, Observe for cervical change, Fetal Wellbeing Reassuring, Epidural when ready and AROM when Appropriate   Clinic [facility] Prenatal Labs  Dating  9 wk [place] Blood type: O/Positive/-- (05/11 1023)   Genetic Screen  NIPS: normal XY Antibody:Negative (05/11 1023)  Anatomic [place] [facility] nml Rubella: 2.44 (05/11 1023)  Varicella: Immune  GTT 8/31 RPR: Non Reactive (08/31 1032)   Rhogam Not needed HBsAg: Negative (05/11 1023)   Vaccines TDAP:  9/14                  Flu Shot:declines HIV: Non Reactive (08/31 1032)   Baby Food Breast                               DXA:JOINOMVE/-- (10/26 1542)  Contraception Nexplanon- may desire immediate postpartum delivery Pap: < 21 y.o.  CBB  No  Valtrex @ 36 weeks [ x]   CS/VBAC NA HgbE: normal  Support Person Hulan Saas, MD, [facility], [facility] 03/13/2020  9:06 AM

## 2020-03-13 NOTE — Plan of Care (Signed)
Pt. admitted to L&D for IOL. Denies leaking of fluid or bleeding. Confirms positive fetal movement. States she felt contractions starting on Saturday 03/10/20, but not in active labor. Pt. made aware of call bell. Monitors applied and assessing.

## 2020-03-13 NOTE — Transfer of Care (Signed)
Immediate Anesthesia Transfer of Care Note  Patient: Sarah Oneill  Procedure(s) Performed: CESAREAN SECTION  Patient Location: Mother/Baby  Anesthesia Type:Epidural  Level of Consciousness: awake, alert  and oriented  Airway & Oxygen Therapy: Patient Spontanous Breathing  Post-op Assessment: Report given to RN and Post -op Vital signs reviewed and stable  Post vital signs: Reviewed and stable  Last Vitals:  Vitals Value Taken Time  BP 109/58 03/13/20 2114  Temp    Pulse    Resp 16 03/13/20 2114  SpO2 97 % 03/13/20 2114    Last Pain:  Vitals:   03/13/20 1920  TempSrc: Oral  PainSc:       Patients Stated Pain Goal: 0 (03/13/20 1628)  Complications: No complications documented.

## 2020-03-13 NOTE — Anesthesia Procedure Notes (Signed)
Epidural Patient location during procedure: OB Start time: 03/13/2020 4:46 PM End time: 03/13/2020 4:55 PM  Staffing Anesthesiologist: Piscitello, Cleda Mccreedy, MD Resident/CRNA: Karoline Caldwell, CRNA Performed: resident/CRNA   Preanesthetic Checklist Completed: patient identified, IV checked, site marked, risks and benefits discussed, surgical consent, monitors and equipment checked, pre-op evaluation and timeout performed  Epidural Patient position: sitting Prep: ChloraPrep Patient monitoring: heart rate, continuous pulse ox and blood pressure Approach: midline Location: L3-L4 Injection technique: LOR saline  Needle:  Needle type: Tuohy  Needle gauge: 17 G Needle length: 9 cm and 9 Needle insertion depth: 7 cm Catheter type: closed end flexible Catheter size: 19 Gauge Catheter at skin depth: 12 cm Test dose: negative and 1.5% lidocaine with Epi 1:200 K  Assessment Events: blood not aspirated, injection not painful, no injection resistance, no paresthesia and negative IV test  Additional Notes 1 attempt Pt. Evaluated and documentation done after procedure finished. Patient identified. Risks/Benefits/Options discussed with patient including but not limited to bleeding, infection, nerve damage, paralysis, failed block, incomplete pain control, headache, blood pressure changes, nausea, vomiting, reactions to medication both or allergic, itching and postpartum back pain. Confirmed with bedside nurse the patient's most recent platelet count. Confirmed with patient that they are not currently taking any anticoagulation, have any bleeding history or any family history of bleeding disorders. Patient expressed understanding and wished to proceed. All questions were answered. Sterile technique was used throughout the entire procedure. Please see nursing notes for vital signs. Test dose was given through epidural catheter and negative prior to continuing to dose epidural or start infusion.  Warning signs of high block given to the patient including shortness of breath, tingling/numbness in hands, complete motor block, or any concerning symptoms with instructions to call for help. Patient was given instructions on fall risk and not to get out of bed. All questions and concerns addressed with instructions to call with any issues or inadequate analgesia.   Patient tolerated the insertion well without immediate complications.Reason for block:procedure for pain

## 2020-03-13 NOTE — Procedures (Signed)
No counts for this  Emergency case. Will put in order for x-ray.

## 2020-03-13 NOTE — Progress Notes (Signed)
RN and MD at bedside. Position changes including left lateral, right lateral and hands/knees completed. IV fluid bolus given. Pt unresponsive to interventions. Fetal intolerance indicate pt needs a C/S. Code Cesarean called.

## 2020-03-13 NOTE — Op Note (Signed)
Cesarean Section Procedure Note Indications: Umbilical cord prolapse and term intrauterine pregnancy Pt is [redacted] weeks pregnant Pre-operative Diagnosis:  umbilical cord prolapse and term intrauterine pregnancy Post-operative Diagnosis: same, delivered. Procedure: Low Transverse Cesarean Section Surgeon: Annamarie Major, MD, FACOG Assistant(s): Dr Bonney Aid, No other capable assistant available, in surgery requiring high level assistant. Anesthesia: Epidural anesthesia Estimated Blood Loss:200  Complications: None; patient tolerated the procedure well. Disposition: PACU - hemodynamically stable. Condition: stable  Findings: A female infant in the cephalic presentation. "[place]" Amniotic fluid - Meconium  Birth weight 7-11 lbs.  Apgars of 8 and 9.  Intact placenta with a three-vessel cord. Grossly normal uterus, tubes and ovaries bilaterally. No intraabdominal adhesions were noted.  Procedure Details   The patient was taken to Operating Room, identified as the correct patient and the procedure verified as C-Section Delivery. A Time Out was held and the above information confirmed. After induction of anesthesia, the patient was draped and prepped in the usual sterile manner. A Pfannenstiel incision was made and carried down through the subcutaneous tissue to the fascia. Fascial incision was made and extended transversely with the Mayo scissors. The fascia was separated from the underlying rectus tissue superiorly and inferiorly. The peritoneum was identified and entered bluntly. Peritoneal incision was extended longitudinally. The utero-vesical peritoneal reflection was incised transversely and a bladder flap was created digitally.  A low transverse hysterotomy was made. The fetus was delivered atraumatically. The umbilical cord was clamped x2 and cut and the infant was handed to the awaiting pediatricians. The placenta was removed intact and appeared normal with a 3-vessel cord.  The uterus was  exteriorized and cleared of all clot and debris. The hysterotomy was closed with running sutures of 0 Vicryl suture. A second imbricating layer was placed with the same suture. Excellent hemostasis was observed. The uterus was returned to the abdomen. The pelvis was irrigated and again, excellent hemostasis was noted.  The On Q Pain pump System was then placed.  Trocars were placed through the abdominal wall into the subfascial space and these were used to thread the silver soaker cathaters into place.The rectus fascia was then reapproximated with running sutures of Maxon, with careful placement not to incorporate the cathaters. Subcutaneous tissues are then irrigated with saline and hemostasis assured.  Skin is then closed with 4-0 vicryl suture in a subcuticular fashion followed by skin adhesive. The cathaters are flushed each with 5 mL of Bupivicaine and stabilized into place with dressing. Instrument, sponge, and needle counts were correct prior to the abdominal closure and at the conclusion of the case. The surgical assistant performed tissue retraction, assistance with suturing, and fundal pressure.   The patient tolerated the procedure well and was transferred to the recovery room in stable condition.   Annamarie Major, MD, [facility], [facility] 03/13/2020  8:56 PM

## 2020-03-13 NOTE — Discharge Summary (Signed)
Postpartum Discharge Summary  Date of Service updated 03/15/2020     Patient Name: Sarah Oneill DOB: 01/21/2001 MRN: [id number]  Date of admission: 03/13/2020 Delivery date:03/13/2020  Delivering provider: Gae Dry  Date of discharge: 03/15/2020  Admitting diagnosis: Encounter for elective induction of labor [Z34.90] Intrauterine pregnancy: [redacted]w[redacted]d    Secondary diagnosis:  Principal Problem:   Encounter for elective induction of labor Active Problems:   Obesity affecting pregnancy   Elevated blood pressure affecting pregnancy in third trimester, antepartum   [redacted] weeks gestation of pregnancy   Umbilical cord prolapse   S/P cesarean section   Postpartum care following cesarean delivery  Additional problems: none    Discharge diagnosis: Term Pregnancy Delivered                                              Post partum procedures:none Augmentation: AROM, Pitocin and Cytotec Complications: None  Hospital course: Induction of Labor With Cesarean Section   19y.o. yo G1P0000 at 337w2das admitted to the hospital 03/13/2020 for induction of labor. Patient had a labor course significant for AROM w meconium noted at 8 cm, and then palpable cord beside fetal vertex, somewhat reducible but not completely, relieve w all 4s positioning, associated with recurrent var decels.  Decision made for CS.. The patient went for cesarean section due to Cord Prolapse. Delivery details are as follows: Membrane Rupture Time/Date: 7:38 PM ,03/13/2020   Delivery Method:C-Section, Low Transverse  Details of operation can be found in separate operative Note.  Patient had an uncomplicated postpartum course. She is ambulating, tolerating a regular diet, passing flatus, and urinating well.  Patient is discharged home in stable condition on 03/15/20.      Newborn Data: Birth date:03/13/2020  Birth time:8:22 PM  Gender:Female  Living status:Living  Apgars:8 ,9  Weight:3490 g                                  Magnesium Sulfate received: No BMZ received: No Rhophylac:N/A MMR:N/A T-DaP:Given prenatally Flu: No Transfusion:No  Physical exam  Vitals:   03/14/20 1900 03/14/20 2000 03/14/20 2321 03/15/20 0800  BP:   116/71 115/83  Pulse: 93 94 86 90  Resp:   18 18  Temp:   98.5 F (36.9 C) 98.5 F (36.9 C)  TempSrc:   Oral Oral  SpO2: 99% 99% 98% 100%  Weight:      Height:       General: alert, cooperative and no distress Lochia: appropriate Uterine Fundus: firm Incision: Healing well with no significant drainage, honeycomb dsg in place. On Q in place DVT Evaluation: No evidence of DVT seen on physical exam. Negative Homan's sign. Labs: Lab Results  Component Value Date   WBC 9.8 03/14/2020   HGB 9.1 (L) 03/14/2020   HCT 27.7 (L) 03/14/2020   MCV 84.7 03/14/2020   PLT 164 03/14/2020   CMP Latest Ref Rng & Units 03/10/2020  Glucose 70 - 99 mg/dL 97  BUN 6 - 20 mg/dL 10  Creatinine 0.44 - 1.00 mg/dL 0.60  Sodium 135 - 145 mmol/L 136  Potassium 3.5 - 5.1 mmol/L 3.8  Chloride 98 - 111 mmol/L 107  CO2 22 - 32 mmol/L 20(L)  Calcium 8.9 - 10.3 mg/dL 8.5(L)  Total Protein  6.5 - 8.1 g/dL 6.3(L)  Total Bilirubin 0.3 - 1.2 mg/dL 0.7  Alkaline Phos 38 - 126 U/L 100  AST 15 - 41 U/L 15  ALT 0 - 44 U/L 10   Edinburgh Score: Edinburgh Postnatal Depression Scale Screening Tool 03/14/2020  I have been able to laugh and see the funny side of things. (No Data)      After visit meds:  Allergies as of 03/15/2020      Reactions   Augmentin [amoxicillin-pot Clavulanate] Hives   Bee Venom Anaphylaxis   "Uses and Epi Pen for this." Per mother   Shellfish Allergy Anaphylaxis, Hives, Other (See Comments)            Discharge Care Instructions  (From admission, onward)         Start     Ordered   03/15/20 0000  Leave dressing on - Keep it clean, dry, and intact until clinic visit        03/15/20 1121          Discharge home in stable condition Infant Feeding:  Breast Infant Disposition:home with mother Discharge instruction: per After Visit Summary and Postpartum booklet. Activity: Advance as tolerated. Pelvic rest for 6 weeks.  Diet: routine diet Anticipated Birth Control: Nexplanon Postpartum Appointment:6 weeks and in one week Additional Postpartum F/U: Incision check 1 week Future Appointments: No future appointments. Follow up Visit:  Follow-up Information    Gae Dry, MD. Schedule an appointment as soon as possible for a visit in 1 week(s).   Specialty: Obstetrics and Gynecology Why: Please call to schedule a 1 week postpartum follow up appointment with Dr. Peggye Ley information: [address] [place] [address] [phone]                   03/15/2020 Imagene Riches, CNM

## 2020-03-13 NOTE — Progress Notes (Signed)
  Labor Progress Note   19 y.o. G1P0000 @ [redacted]w[redacted]d , admitted for  Pregnancy, Labor Management.   Subjective:  Mod pain w ctxs q 5 min One dose Cytotec at 0900  Objective:  BP 122/77 (BP Location: Left Arm)   Pulse 95   Temp 99.3 F (37.4 C) (Oral)   Resp 16   Ht 5\' 3"  (1.6 m)   Wt 102.2 kg   LMP 06/06/2019 (Approximate)   BMI 39.91 kg/m  Abd: gravid, ND, FHT present, moderate tenderness on exam Extr: trace to 1+ bilateral pedal edema SVE: CERVIX: 3 cm dilated, 80 effaced, -2 station  EFM: FHR: 140 bpm, variability: moderate,  accelerations:  Present,  decelerations:  Absent Toco: Frequency: Every 5 minutes Labs: I have reviewed the patient's lab results.   Assessment & Plan:  G1P0000 @ [redacted]w[redacted]d, admitted for  Pregnancy and Labor/Delivery Management  1. Pain management: none. 2. FWB: FHT category 1.  3. ID: GBS positive 4. Labor management: ABX for GBS Pitocin instead of Cytotec now Stadol or epidural as needed  All discussed with patient, see orders  [redacted]w[redacted]d, MD, Annamarie Major Ob/Gyn, [facility] 03/13/2020  1:37 PM

## 2020-03-13 NOTE — Anesthesia Preprocedure Evaluation (Addendum)
Anesthesia Evaluation  Patient identified by MRN, date of birth, ID band Patient awake    Reviewed: Allergy & Precautions, H&P , NPO status , Patient's Chart, lab work & pertinent test results  Airway Mallampati: III  TM Distance: >3 FB Neck ROM: full    Dental no notable dental hx.    Pulmonary asthma ,    Pulmonary exam normal        Cardiovascular Normal cardiovascular exam     Neuro/Psych  Headaches, PSYCHIATRIC DISORDERS Anxiety    GI/Hepatic GERD  ,  Endo/Other    Renal/GU      Musculoskeletal   Abdominal   Peds  Hematology  (+) Blood dyscrasia, anemia ,   Anesthesia Other Findings Past Medical History: No date: Anemia No date: Anxiety No date: Asthma 04/19/2014: Vitamin D deficiency   Reproductive/Obstetrics (+) Pregnancy                            Anesthesia Physical Anesthesia Plan  ASA: III and emergent  Anesthesia Plan: Epidural   Post-op Pain Management:    Induction:   PONV Risk Score and Plan:   Airway Management Planned: Natural Airway  Additional Equipment:   Intra-op Plan:   Post-operative Plan:   Informed Consent: I have reviewed the patients History and Physical, chart, labs and discussed the procedure including the risks, benefits and alternatives for the proposed anesthesia with the patient or authorized representative who has indicated his/her understanding and acceptance.     Dental Advisory Given  Plan Discussed with: Anesthesiologist and CRNA  Anesthesia Plan Comments: (Patient brought emergently to OR for fetal cord compression, plan to use epidural for C section  Patient reports no bleeding problems and no anticoagulant use.   Patient consented for risks of anesthesia including but not limited to:  - adverse reactions to medications - risk of bleeding, infection and or nerve damage from epidural that could lead to paralysis - risk of  headache or failed epidural - nerve damage due to positioning - that if epidural is used for C-section that there is a chance of epidural failure requiring spinal placement or conversion to GA - Damage to heart, brain, lungs, other parts of body or loss of life  Patient voiced understanding.)      Anesthesia Quick Evaluation

## 2020-03-14 ENCOUNTER — Encounter: Payer: Medicaid Other | Admitting: Obstetrics & Gynecology

## 2020-03-14 ENCOUNTER — Encounter: Payer: Self-pay | Admitting: Obstetrics & Gynecology

## 2020-03-14 LAB — CBC
HCT: 27.7 % — ABNORMAL LOW (ref 36.0–46.0)
Hemoglobin: 9.1 g/dL — ABNORMAL LOW (ref 12.0–15.0)
MCH: 27.8 pg (ref 26.0–34.0)
MCHC: 32.9 g/dL (ref 30.0–36.0)
MCV: 84.7 fL (ref 80.0–100.0)
Platelets: 164 10*3/uL (ref 150–400)
RBC: 3.27 MIL/uL — ABNORMAL LOW (ref 3.87–5.11)
RDW: 16.4 % — ABNORMAL HIGH (ref 11.5–15.5)
WBC: 9.8 10*3/uL (ref 4.0–10.5)
nRBC: 0 % (ref 0.0–0.2)

## 2020-03-14 LAB — RPR: RPR Ser Ql: NONREACTIVE

## 2020-03-14 MED ORDER — IBUPROFEN 100 MG/5ML PO SUSP
600.0000 mg | Freq: Four times a day (QID) | ORAL | Status: DC
  Filled 2020-03-14 (×3): qty 30

## 2020-03-14 MED ORDER — IBUPROFEN 600 MG PO TABS
600.0000 mg | ORAL_TABLET | Freq: Four times a day (QID) | ORAL | Status: DC
  Administered 2020-03-15 (×3): 600 mg via ORAL
  Filled 2020-03-14 (×3): qty 1

## 2020-03-14 NOTE — Anesthesia Postprocedure Evaluation (Signed)
Anesthesia Post Note  Patient: Sarah Oneill  Procedure(s) Performed: CESAREAN SECTION  Patient location during evaluation: Mother Baby Anesthesia Type: Epidural Level of consciousness: awake and alert Pain management: pain level controlled Vital Signs Assessment: post-procedure vital signs reviewed and stable Respiratory status: spontaneous breathing, nonlabored ventilation and respiratory function stable Cardiovascular status: stable Postop Assessment: no headache, no backache and epidural receding Anesthetic complications: no   No complications documented.   Last Vitals:  Vitals:   03/14/20 0400 03/14/20 0700  BP:    Pulse: 88 81  Resp:    Temp:    SpO2: 97% 96%    Last Pain:  Vitals:   03/14/20 0356  TempSrc: Oral  PainSc:                  Rosanne Gutting

## 2020-03-14 NOTE — Progress Notes (Signed)
Obstetric Postpartum/PostOperative Daily Progress Note Subjective:  19 y.o. G1P1001 post-operative day # 1 status post primary cesarean delivery.  She is ambulating, is tolerating po, is not voiding spontaneously. Her catheter came out about 8 hours ago and is due for a voiding trial.  Her pain is well controlled on PO pain medications. Her lochia is less than menses.   Medications SCHEDULED MEDICATIONS  . acetaminophen  650 mg Oral Q6H  . ketorolac  30 mg Intravenous Q6H   Or  . ketorolac  30 mg Intramuscular Q6H  . prenatal multivitamin  1 tablet Oral Q1200  . senna-docusate  2 tablet Oral Q24H  . simethicone  80 mg Oral TID PC  . simethicone  80 mg Oral Q24H    MEDICATION INFUSIONS  . bupivacaine 0.25 % ON-Q pump DUAL CATH 400 mL    . lactated ringers Stopped (03/13/20 2344)  . naLOXone [facility]) adult infusion for PRURITIS    . oxytocin 2.5 Units/hr (03/13/20 2329)    PRN MEDICATIONS  coconut oil, witch hazel-glycerin **AND** dibucaine, diphenhydrAMINE **OR** diphenhydrAMINE, diphenhydrAMINE, menthol-cetylpyridinium, nalbuphine **OR** nalbuphine, nalbuphine **OR** nalbuphine, naloxone **AND** sodium chloride flush, naLOXone (NARCAN) adult infusion for PRURITIS, ondansetron (ZOFRAN) IV, oxyCODONE, simethicone, zolpidem    Objective:   Vitals:   03/14/20 0800 03/14/20 0819 03/14/20 0900 03/14/20 1115  BP:  116/78  (!) 142/74  Pulse:  90 95 77  Resp:  17  20  Temp:  99 F (37.2 C)  98 F (36.7 C)  TempSrc:  Oral  Oral  SpO2: 96%  96% 99%  Weight:      Height:        Current Vital Signs 24h Vital Sign Ranges  T 98 F (36.7 C) Temp  Avg: 98.4 F (36.9 C)  Min: 98 F (36.7 C)  Max: 99 F (37.2 C)  BP (!) 142/74 BP  Min: 48/35  Max: 157/89  HR 77 Pulse  Avg: 95.9  Min: 77  Max: 159  RR 20 Resp  Avg: 17.9  Min: 12  Max: 29  SaO2 99 % Room Air SpO2  Avg: 98.4 %  Min: 94 %  Max: 100 %       24 Hour I/O Current Shift I/O  Time Ins Outs 11/23 0701 - 11/24 0700 In: 920.4  [I.V.:920.4] Out: 1651 [Urine:1125] 11/24 0701 - 11/24 1900 In: 430.2 [I.V.:430.2] Out: -   General: NAD Pulmonary: no increased work of breathing Abdomen: non-distended, non-tender, fundus firm at level of umbilicus Inc: Clean/dry/intact Extremities: no edema, no erythema, no tenderness  Labs:  Recent Labs  Lab 03/10/20 2354 03/13/20 0826 03/14/20 0717  WBC 9.9 8.2 9.8  HGB 9.4* 10.7* 9.1*  HCT 29.2* 32.8* 27.7*  PLT 175 181 164     Assessment:   19 y.o. G1P1001 postoperative day # 1 status post primary cesarean section  Plan:  1) Acute blood loss anemia - hemodynamically stable and asymptomatic - po ferrous sulfate  2) O POS / Rubella 2.44 (05/11 1023)/ Varicella Immune  3) TDAP status received 01/03/2020, Declined flu vaccine  4) attempting breast feeding /Contraception = Nexplanon   5) may have to replace catheter or I and O if unable to void.  6) Disposition: home POD#2-3  Conard Novak, MD, [facility] 03/14/2020 12:47 PM

## 2020-03-14 NOTE — Lactation Note (Signed)
This note was copied from a baby's chart. Lactation Consultation Note  Patient Name: Sarah Oneill Date: 03/14/2020 Reason for consult: Follow-up assessment;1st time breastfeeding;Term  Lactation follow-up. Parents in bed skin to skin with baby. Parents report that he just finished feeding for 5 minutes without shield. Mom said applying the shield was to difficult and that baby stayed on the breast with strong tugs without it. Praised mom for feeding baby, discussed attempts every 2-3 hours, use of hand expression between feeds or option of spoon/cup feeding as needed.  Reviewed with parents newborn feeding patterns, early cues, skin to skin, output expectations, and potential for growth spurts/cluster feeding overnight tonight, encouraging to put to breast with all cues. Discussed impact that continued formula may have on establishment of adequate supply and encouraged to put baby to breast before offering formula- tips for keeping baby awake while at the breast reviewed.  Mom had no questions/concerns at this time.  Maternal Data Formula Feeding for Exclusion: Yes Reason for exclusion: Mother's choice to formula and breast feed on admission Has patient been taught Hand Expression?: Yes Does the patient have breastfeeding experience prior to this delivery?: No  Feeding Feeding Type: Breast Fed  LATCH Score Latch: Too sleepy or reluctant, no latch achieved, no sucking elicited.  Audible Swallowing: None  Type of Nipple: Everted at rest and after stimulation (short)  Comfort (Breast/Nipple): Soft / non-tender  Hold (Positioning): Assistance needed to correctly position infant at breast and maintain latch.  LATCH Score: 5  Interventions Interventions: Breast feeding basics reviewed;Skin to skin;Hand express  Lactation Tools Discussed/Used Nipple shield size:  (without shield)   Consult Status Consult Status: Follow-up Date: 03/14/20 Follow-up type: Call as  needed    Danford Bad 03/14/2020, 3:06 PM

## 2020-03-14 NOTE — Lactation Note (Signed)
This note was copied from a baby's chart. Lactation Consultation Note  Patient Name: Sarah Oneill [id number] Date: 03/14/2020 Reason for consult: Follow-up assessment;1st time breastfeeding;Term;Other (Comment) (c/s, nipple shield)  Lactation present for breastfeeding attempt. Discussed with parents the need for at least 8 attempts with feeding in the first 24 hours, the use of hand expression and skin to skin to encourage feedings. LC unwrapped baby, removed outfit, baby did cry out, chasing LC's finger on his cheek. Brought baby to mom's breast in cross-cradle hold, hand expressed drops of colostrum, baby did lick all expressed but was too sleepy to sustain a latch. Body language was indicative of being content: relaxed, open hands, calm face, closed eyes. LC helped parents get comfortable, and brought baby skin to skin in prone position on mom's chest. Reviewed benefits of skin to skin, and early hunger cues. Encouraged to call with early cues for breastfeeding assistance.  Maternal Data Formula Feeding for Exclusion: Yes Reason for exclusion: Mother's choice to formula and breast feed on admission Has patient been taught Hand Expression?: Yes Does the patient have breastfeeding experience prior to this delivery?: No  Feeding Feeding Type: Breast Fed  LATCH Score Latch: Too sleepy or reluctant, no latch achieved, no sucking elicited.  Audible Swallowing: None  Type of Nipple: Everted at rest and after stimulation (short)  Comfort (Breast/Nipple): Soft / non-tender  Hold (Positioning): Assistance needed to correctly position infant at breast and maintain latch.  LATCH Score: 5  Interventions Interventions: Breast feeding basics reviewed;Assisted with latch;Skin to skin;Hand express;Adjust position;Support pillows;Position options  Lactation Tools Discussed/Used Nipple shield size: 16   Consult Status Consult Status: Follow-up Date: 03/14/20 Follow-up type:  In-patient    Danford Bad 03/14/2020, 1:42 PM

## 2020-03-14 NOTE — Lactation Note (Signed)
This note was copied from a baby's chart. Lactation Consultation Note  Patient Name: Sarah Oneill [id number] Date: 03/14/2020 Reason for consult: Initial assessment;1st time breastfeeding;Term;Other (Comment) (Nipple shield)  Initial lactation visit. Mom is G1P1, c/s from last night. Nipple shield given by transition RN for flat nipples.  MB RN reports baby has been spitty/gaggy this morning, disinterested in eating. LC assess baby, laying comfortably in mom's arms, relaxed body language, sleeping soundly.  LC attempted to arouse baby, but baby remained asleep and uninterested. LC reviewed with parents typical newborn feeding behaviors, early cues, and impact that baby being spitty and gaggy may have on his desire to eat. Encouraged skin to skin by both mom and dad with baby throughout the day, resting as baby rests, and aiming for a minimum of at least 8 attempts in first 24 hours. LC demonstrated and taught mom hand expression, encouraged to hand express throughout the day for stimulation and opportunity to spoon/syringe feed baby if needed. Drops were easily expressed by [facility], mom working on technique.  Encouraged parents to reach out for ongoing breastfeeding support as needed today.  Maternal Data Formula Feeding for Exclusion: Yes Reason for exclusion: Mother's choice to formula and breast feed on admission Has patient been taught Hand Expression?: Yes Does the patient have breastfeeding experience prior to this delivery?: No  Feeding    LATCH Score                   Interventions Interventions: Breast feeding basics reviewed;Hand express  Lactation Tools Discussed/Used Nipple shield size: 16   Consult Status Consult Status: Follow-up Date: 03/14/20 Follow-up type: In-patient    Danford Bad 03/14/2020, 10:26 AM

## 2020-03-14 NOTE — Progress Notes (Addendum)
   03/14/20 0935  Clinical Encounter Type  Visited With Patient and family together  Visit Type Initial  Referral From Chaplain  Consult/Referral To Chaplain  Chaplain visited with Pt, her boyfriend, and her mother. Pt explained all that she had endured the night before prior to having a C-section. Pt said she cried because she did not want to have a C-section. Chaplain told her that she understood, but was happy her and her son were doing find. Baby Boy kept jumping while sleeping and chaplain asked if she could hold him and mother said yes. While holding baby boy, chaplain rubbed his back and prayed silently.Chaplain also interacted with FOB and maternal grandmother. Chaplain asked if she could pray with them and Pt said yes. Chaplain prayed and left shortly afterwards.

## 2020-03-14 NOTE — Anesthesia Post-op Follow-up Note (Signed)
  Anesthesia Pain Follow-up Note  Patient: Sarah Oneill  Day #: 1  Date of Follow-up: 03/14/2020 Time: 7:47 AM  Last Vitals:  Vitals:   03/14/20 0400 03/14/20 0700  BP:    Pulse: 88 81  Resp:    Temp:    SpO2: 97% 96%    Level of Consciousness: alert  Pain: none   Side Effects:None  Catheter Site Exam:clean, dry, no drainage     Plan: D/C from anesthesia care at surgeon's request  Rosanne Gutting

## 2020-03-15 MED ORDER — IBUPROFEN 600 MG PO TABS
600.0000 mg | ORAL_TABLET | Freq: Four times a day (QID) | ORAL | 0 refills | Status: DC
Start: 2020-03-15 — End: 2021-07-30

## 2020-03-15 NOTE — Discharge Instructions (Signed)
Please call your doctor or return to the ER if you experience any chest pains, shortness of breath, dizziness, visual changes, severe headache (unrelieved by pain meds), fever greater than 101, any heavy bleeding (saturating more than 1 pad per hour), large clots, or foul smelling discharge, any worsening abdominal pain and cramping that is not controlled by pain medication, any calf/leg pain or redness, any breast concerns (redness/pain), or any signs of postpartum depression. No tampons, enemas, douches, or sexual intercourse for 6 weeks. Also avoid tub baths, hot tubs, or swimming for 6 weeks.  ° ° °Check your incision daily for any signs of infection such as redness, warmth, swelling, increased pain, or pus/foul smelling drainage ° ° °Activity: do not lift over 10 lbs for 6 weeks °No driving for 2 weeks  °Pelvic rest for 6 weeks  °

## 2020-03-15 NOTE — Progress Notes (Signed)
Discharge order received from doctor. Reviewed discharge instructions including On-Q pump removal instructions and prescriptions with patient and answered all questions. Follow up appointment instructions given. Patient verbalized understanding. ID bands checked. Patient discharged home with infant via wheelchair by nursing/auxillary.    Inge Rise, RN

## 2020-03-20 ENCOUNTER — Encounter: Payer: Self-pay | Admitting: Obstetrics & Gynecology

## 2020-03-20 ENCOUNTER — Ambulatory Visit (INDEPENDENT_AMBULATORY_CARE_PROVIDER_SITE_OTHER): Payer: Medicaid Other | Admitting: Obstetrics & Gynecology

## 2020-03-20 ENCOUNTER — Telehealth: Payer: Self-pay | Admitting: Obstetrics & Gynecology

## 2020-03-20 ENCOUNTER — Other Ambulatory Visit: Payer: Self-pay

## 2020-03-20 DIAGNOSIS — Z98891 History of uterine scar from previous surgery: Secondary | ICD-10-CM

## 2020-03-20 NOTE — Progress Notes (Signed)
  Postoperative Follow-up Patient presents post op from recent Cesarean Section performed for umb cord prolapse, 1 week ago.  Subjective: Patient reports some improvement in her immediate post op symptoms. Eating a regular diet without difficulty. The patient is not having any pain.  Activity: normal activities of daily living. Patient reports additional symptom's since surgery of appropriate lochia, no signs of depression, and no signs of mastitis.  Objective: There were no vitals taken for this visit. Physical Exam Constitutional:      General: She is not in acute distress.    Appearance: She is well-developed.  Cardiovascular:     Rate and Rhythm: Normal rate.  Pulmonary:     Effort: Pulmonary effort is normal.  Abdominal:     General: There is no distension.     Palpations: Abdomen is soft.     Tenderness: There is no abdominal tenderness.     Comments: Incision Healing Well   Musculoskeletal:        General: Normal range of motion.  Neurological:     Mental Status: She is alert and oriented to person, place, and time.     Cranial Nerves: No cranial nerve deficit.  Skin:    General: Skin is warm and dry.     Assessment: s/p : Cesarean Section for umb cord prolapse progressing well  Plan: Patient has done well after her Cesarean Section with no apparent complications.  I have discussed the post-operative course to date, and the expected progress moving forward.  The patient understands what complications to be concerned about.  I will see the patient in routine follow up, or sooner if needed.    Activity plan: No heavy lifting.Marland Kitchen  Pelvic rest. She desires Nexplanon for postpartum contraception.  Letitia Libra 03/20/2020, 8:07 AM

## 2020-03-20 NOTE — Telephone Encounter (Signed)
Patient is scheduled for 6 week PP  w Nexplanon  On 04/19/20 with [facility]

## 2020-03-26 NOTE — Telephone Encounter (Signed)
Noted. Will order to arrive by apt date/time. 

## 2020-04-19 ENCOUNTER — Other Ambulatory Visit: Payer: Self-pay

## 2020-04-19 ENCOUNTER — Encounter: Payer: Self-pay | Admitting: Obstetrics & Gynecology

## 2020-04-19 ENCOUNTER — Ambulatory Visit (INDEPENDENT_AMBULATORY_CARE_PROVIDER_SITE_OTHER): Payer: Medicaid Other | Admitting: Obstetrics & Gynecology

## 2020-04-19 DIAGNOSIS — Z30017 Encounter for initial prescription of implantable subdermal contraceptive: Secondary | ICD-10-CM

## 2020-04-19 NOTE — Progress Notes (Signed)
  OBSTETRICS POSTPARTUM CLINIC PROGRESS NOTE  Subjective:     Sarah Oneill is a 19 y.o. G47P1001 female who presents for a postpartum visit. She is 5 weeks postpartum following a Term pregnancy, Single fetus or Pregnancy complicated by: umb cord prolapse and delivery by C-section.  I have fully reviewed the prenatal and intrapartum course. Anesthesia: epidural.  Postpartum course has been complicated by uncomplicated.  Baby is feeding by Bottle.  Bleeding: patient has  resumed menses.  Bowel function is normal. Bladder function is normal.  Patient is not sexually active. Contraception method desired is Nexplanon.  Postpartum depression screening: positive. [place] 10.  Mostly concerns in balancing baby duties with plans for work, and need for more involvement of FOB to help w care of [place]  The following portions of the patient's history were reviewed and updated as appropriate: allergies, current medications, past family history, past medical history, past social history, past surgical history and problem list.  Review of Systems Pertinent items are noted in HPI.  Objective:    BP 120/80   Ht 5\' 3"  (1.6 m)   Wt 209 lb (94.8 kg)   BMI 37.02 kg/m   General:  alert and no distress   Breasts:  inspection negative, no nipple discharge or bleeding, no masses or nodularity palpable  Lungs: clear to auscultation bilaterally  Heart:  regular rate and rhythm, S1, S2 normal, no murmur, click, rub or gallop  Abdomen: soft, non-tender; bowel sounds normal; no masses,  no organomegaly.  Well healed Pfannenstiel incision   Vulva:  normal  Vagina: normal vagina, no discharge, exudate, lesion, or erythema  Cervix:  no cervical motion tenderness and no lesions  Corpus: normal size, contour, position, consistency, mobility, non-tender  Adnexa:  normal adnexa and no mass, fullness, tenderness  Rectal Exam: Not performed.          Assessment:  Post Partum Care visit 1. Postpartum care  following cesarean delivery  2. Nexplanon insertion See below, pros and cons and alternatives discussed  Plan:  See orders and Patient Instructions Resume all normal activities Follow up in: 9 months or as needed.   PAP due when turns 20.   Nexplanon Insertion  Patient given informed consent, signed copy in the chart, time out was performed. Appropriate time out taken.  Patient's LEFT arm was prepped and draped in the usual sterile fashion.. The ruler used to measure and mark insertion area.  Pt was prepped with betadine swab and then injected with 3 cc of 2% lidocaine with epinephrine. Nexplanon removed form packaging,  Device confirmed in needle, then inserted full length of needle and withdrawn per handbook instructions.  Pt insertion site covered with steri-strip and a bandage.   Minimal blood loss.  Pt tolerated the procedure well.   , MD, Annamarie Major Ob/Gyn, [facility] 04/19/2020  8:17 AM

## 2020-04-19 NOTE — Patient Instructions (Signed)
Nexplanon Instructions After Insertion  Keep bandage clean and dry for 24 hours  May use ice/Tylenol/Ibuprofen for soreness or pain  If you develop fever, drainage or increased warmth from incision site-contact office immediately   Thank you for choosing [facility]. As part of our ongoing efforts to improve patient experience, we would appreciate your feedback. Please fill out the short survey that you will receive by mail or MyChart. Your opinion is important to us! -Dr Sible Straley  

## 2021-02-12 NOTE — Telephone Encounter (Signed)
Nexplanon rcvd/charged 04/19/2020

## 2021-04-24 ENCOUNTER — Ambulatory Visit: Payer: Medicaid Other | Admitting: Obstetrics and Gynecology

## 2021-04-24 NOTE — Progress Notes (Deleted)
PCP:  Serita Grit, PA-C   No chief complaint on file.    HPI:      Sarah Oneill is a 21 y.o. G1P1001 whose LMP was No LMP recorded., presents today for her annual examination.  Her menses are {norm/abn:715}, lasting {number: 22536} days.  Dysmenorrhea {dysmen:716}. She {does:18564} have intermenstrual bleeding.  Sex activity: {sex active: 315163}.  Last Pap: {ZLDJ:570177939}  Results were: {norm/abn:16707::"no abnormalities"} /neg HPV DNA *** Hx of STDs: {STD hx:14358}  Last mammogram: {date:304500300}  Results were: {norm/abn:13465} There is no FH of breast cancer. There is no FH of ovarian cancer. The patient {does:18564} do self-breast exams.  Tobacco use: {tob:20664} Alcohol use: {Alcohol:11675} No drug use.  Exercise: {exercise:31265}  She {does:18564} get adequate calcium and Vitamin D in her diet.  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   S/P cesarean section 03/13/2020   Elevated blood pressure affecting pregnancy in third trimester, antepartum 03/11/2020   Obesity affecting pregnancy 03/07/2020   HSV (herpes simplex virus) anogenital infection 10/10/2019   ADHD (attention deficit hyperactivity disorder) 08/02/2019   Bee sting allergy 08/02/2019   Shellfish allergy 08/02/2019   Recurrent UTI 01/29/2018   Chronic constipation 10/20/2017   Anxiety state 07/18/2015   Astigmatism 08/20/2011   BMI (body mass index), pediatric, greater than or equal to 95% for age 09/20/2011   Insulin resistance 08/20/2011    Past Surgical History:  Procedure Laterality Date   CESAREAN SECTION  03/13/2020   Procedure: CESAREAN SECTION;  Surgeon: Nadara Mustard, MD;  Location: [facility] ORS;  Service: Obstetrics;;   HERNIA REPAIR     [facility]   TONSILLECTOMY AND  ADENOIDECTOMY N/A 08/30/2014   Procedure: TONSILLECTOMY AND ADENOIDECTOMY;  Surgeon: Bud Face, MD;  Location: [facility];  Service: ENT;  Laterality: N/A;  adenoids cauterized no tissue sent to lab    Family History  Problem Relation Age of Onset   Seizures Mother    Bipolar disorder Mother    ADD / ADHD Mother    Anxiety disorder Mother    Migraines Mother    Migraines Father    Diabetes Father    Hypertension Father    Bipolar disorder Sister    ADD / ADHD Sister    Migraines Sister    Depression Maternal Grandmother    Migraines Maternal Grandmother    Hypertension Maternal Grandmother    Asthma Maternal Grandmother    Anxiety disorder Maternal Grandmother    Hearing loss Maternal Grandmother     Social History   Socioeconomic History   Marital status: Significant Other    Spouse name: Jeoffrey Massed   Number of children: Not on file   Years of education: Not on file   Highest education level: Not on file  Occupational History   Not on file  Tobacco Use   Smoking status: Passive Smoke Exposure - Never Smoker   Smokeless tobacco: Never  Vaping Use   Vaping Use: Never used  Substance and Sexual Activity   Alcohol use: No   Drug use: No   Sexual activity: Yes    Partners: Male    Birth control/protection: Condom    Comment: nexplanon  Other Topics Concern   Not on file  Social History Narrative   Ariyanah attends 12th grade at [employer] She is doing well.  She was retained once in 1 st grade.   Lives with her father. She enjoys playing volley ball, cheer and sleep   Social Determinants of Health   Financial Resource Strain: Not on file  Food Insecurity: Not on file  Transportation Needs: Not on file  Physical Activity: Not on file  Stress: Not on file  Social Connections: Not on file  Intimate Partner Violence: Not on file     Current Outpatient Medications:    albuterol (VENTOLIN HFA) 108 (90 Base) MCG/ACT inhaler, Inhale 2 puffs into the  lungs every 4 (four) hours as needed.  (Patient not taking: Reported on 04/19/2020), Disp: , Rfl:    EPINEPHrine 0.3 mg/0.3 mL IJ SOAJ injection, Inject 0.3 mg into the muscle as needed. For Bee stings  (Patient not taking: Reported on 04/19/2020), Disp: , Rfl:    ibuprofen (ADVIL) 600 MG tablet, Take 1 tablet (600 mg total) by mouth 4 (four) times daily. (Patient not taking: Reported on 04/19/2020), Disp: 30 tablet, Rfl: 0   Prenat w/o A Vit-FeFum-FePo-FA (CONCEPT OB) 130-92.4-1 MG CAPS, Take 1 capsule by mouth daily. (Patient not taking: Reported on 04/19/2020), Disp: 30 capsule, Rfl: 11   valACYclovir (VALTREX) 500 MG tablet, Take 1 tablet (500 mg total) by mouth 2 (two) times daily. (Patient not taking: Reported on 04/19/2020), Disp: 60 tablet, Rfl: 1     ROS:  Review of Systems BREAST: No symptoms   Objective: There were no vitals taken for this visit.   OBGyn Exam  Results: No results found for this or any previous visit (from the past 24 hour(s)).  Assessment/Plan: No diagnosis found.  No orders of the defined types were placed in this encounter.            GYN counsel {counseling: 16159}     F/U  No follow-ups on file.  Pesach Frisch B. Dominick Morella, PA-C 04/24/2021 11:50 AM

## 2021-07-29 NOTE — Progress Notes (Signed)
? ?PCP:  Sarah Oneill, Stephen Trevor, PA-C ? ? ?Chief Complaint  ?Patient presents with  ? Gynecologic Exam  ?  Back pain possibly due to breasts?  ? ? ? ?HPI: ?     [name] is a 21 y.o. G1P1001 whose LMP was No LMP recorded. Patient has had an implant., presents today for her annual examination.  Her menses are absent due to nexplanon, no BTB, no dysmen. ? ?Sex activity: single partner, contraception - Nexplanon placed 04/19/20 ?Last Pap: NA due to age ? ?There is no FH of breast cancer. There is no FH of ovarian cancer. The patient does not do self-breast exams. ? ?Tobacco use: The patient denies current or previous tobacco use. ?Alcohol use: none ?No drug use.  ?Exercise: moderately active ? ?She does get adequate calcium but not Vitamin D in her diet. ?Pt noticed strong urine odor this AM, no other UTI sx. Drinks some water daily, also sodas/caffeine. Hx of UTIs in past.  ? ?Patient Active Problem List  ? Diagnosis Date Noted  ? Postpartum care following cesarean delivery 03/15/2020  ? Encounter for elective induction of labor 03/13/2020  ? [redacted] weeks gestation of pregnancy 03/13/2020  ? Umbilical cord prolapse 03/13/2020  ? S/P cesarean section 03/13/2020  ? Elevated blood pressure affecting pregnancy in third trimester, antepartum 03/11/2020  ? Obesity affecting pregnancy 03/07/2020  ? HSV (herpes simplex virus) anogenital infection 10/10/2019  ? ADHD (attention deficit hyperactivity disorder) 08/02/2019  ? Bee sting allergy 08/02/2019  ? Shellfish allergy 08/02/2019  ? Recurrent UTI 01/29/2018  ? Chronic constipation 10/20/2017  ? Anxiety state 07/18/2015  ? Astigmatism 08/20/2011  ? BMI (body mass index), pediatric, greater than or equal to 95% for age 24/04/2011  ? Insulin resistance 08/20/2011  ? ? ?Past Surgical History:  ?Procedure Laterality Date  ? CESAREAN SECTION  03/13/2020  ? Procedure: CESAREAN SECTION;  Surgeon: Nadara MustardHarris, Robert P, MD;  Location: [facility];  Service: Obstetrics;;  ? HERNIA REPAIR     ? [facility]  ? TONSILLECTOMY AND ADENOIDECTOMY N/A 08/30/2014  ? Procedure: TONSILLECTOMY AND ADENOIDECTOMY;  Surgeon: Bud Facereighton Vaught, MD;  [facility];  Service: ENT;  Laterality: N/A;  adenoids cauterized no tissue sent to lab  ? WISDOM TOOTH EXTRACTION    ? ? ?Family History  ?Problem Relation Age of Onset  ? Seizures Mother   ? Bipolar disorder Mother   ? ADD / ADHD Mother   ? Anxiety disorder Mother   ? Migraines Mother   ? Migraines Father   ? Diabetes Father   ? Hypertension Father   ? Bipolar disorder Sister   ? ADD / ADHD Sister   ? Migraines Sister   ? Depression Maternal Grandmother   ? Migraines Maternal Grandmother   ? Hypertension Maternal Grandmother   ? Asthma Maternal Grandmother   ? Anxiety disorder Maternal Grandmother   ? Hearing loss Maternal Grandmother   ? ? ?Social History  ? ?Socioeconomic History  ? Marital status: Significant Other  ?  Spouse name: Sarah Oneill  ? Number of children: Not on file  ? Years of education: Not on file  ? Highest education level: Not on file  ?Occupational History  ? Not on file  ?Tobacco Use  ? Smoking status: Never  ?  Passive exposure: Yes  ? Smokeless tobacco: Never  ?Vaping Use  ? Vaping Use: Never used  ?Substance and Sexual Activity  ? Alcohol use: No  ? Drug use: No  ?  Sexual activity: Yes  ?  Partners: Male  ?  Birth control/protection: Implant  ?Other Topics Concern  ? Not on file  ?Social History Narrative  ? Sarah Oneill attends 12th grade at [employer]. She is doing well.  She was retained once in 1 st grade.  ? Lives with her father. She enjoys playing volley ball, cheer and sleep  ? ?Social Determinants of Health  ? ?Financial Resource Strain: Not on file  ?Food Insecurity: Not on file  ?Transportation Needs: Not on file  ?Physical Activity: Not on file  ?Stress: Not on file  ?Social Connections: Not on file  ?Intimate Partner Violence: Not on file  ? ? ? ?Current Outpatient Medications:  ?  albuterol (VENTOLIN HFA) 108 (90 Base) MCG/ACT  inhaler, Inhale 2 puffs into the lungs every 4 (four) hours as needed., Disp: , Rfl:  ?  EPINEPHrine 0.3 mg/0.3 mL IJ SOAJ injection, Inject 0.3 mg into the muscle as needed. For Bee stings, Disp: , Rfl:  ?  etonogestrel (NEXPLANON) 68 MG IMPL implant, 1 each by Subdermal route once., Disp: , Rfl:  ?  nitrofurantoin, macrocrystal-monohydrate, (MACROBID) 100 MG capsule, Take 1 capsule (100 mg total) by mouth 2 (two) times daily for 5 days., Disp: 10 capsule, Rfl: 0 ? ? ? ? ?ROS: ? ?Review of Systems  ?Constitutional:  Negative for fatigue, fever and unexpected weight change.  ?Respiratory:  Negative for cough, shortness of breath and wheezing.   ?Cardiovascular:  Negative for chest pain, palpitations and leg swelling.  ?Gastrointestinal:  Negative for blood in stool, constipation, diarrhea, nausea and vomiting.  ?Endocrine: Negative for cold intolerance, heat intolerance and polyuria.  ?Genitourinary:  Negative for dyspareunia, dysuria, flank pain, frequency, genital sores, hematuria, menstrual problem, pelvic pain, urgency, vaginal bleeding, vaginal discharge and vaginal pain.  ?Musculoskeletal:  Negative for back pain, joint swelling and myalgias.  ?Skin:  Negative for rash.  ?Neurological:  Negative for dizziness, syncope, light-headedness, numbness and headaches.  ?Hematological:  Negative for adenopathy.  ?Psychiatric/Behavioral:  Negative for agitation, confusion, sleep disturbance and suicidal ideas. The patient is not nervous/anxious.   ?BREAST: No symptoms ? ? ?Objective: ?BP 126/90   Ht 5\' 3"  (1.6 m)   Wt 255 lb (115.7 kg)   Breastfeeding No   BMI 45.17 kg/m?  ? ? ?Physical Exam ?Constitutional:   ?   Appearance: She is well-developed.  ?Genitourinary:  ?   Vulva normal.  ?   Genitourinary Comments: LARGE PENDULOUS BREASTS  ?   Right Labia: No rash, tenderness or lesions. ?   Left Labia: No tenderness, lesions or rash. ?   No vaginal discharge, erythema or tenderness.  ? ?   Right Adnexa: not tender and  no mass present. ?   Left Adnexa: not tender and no mass present. ?   No cervical friability or polyp.  ?   Uterus is not enlarged or tender.  ?Breasts: ?   Right: No mass, nipple discharge, skin change or tenderness.  ?   Left: No mass, nipple discharge, skin change or tenderness.  ?Neck:  ?   Thyroid: No thyromegaly.  ?Cardiovascular:  ?   Rate and Rhythm: Normal rate and regular rhythm.  ?   Heart sounds: Normal heart sounds. No murmur heard. ?Pulmonary:  ?   Effort: Pulmonary effort is normal.  ?   Breath sounds: Normal breath sounds.  ?Abdominal:  ?   Palpations: Abdomen is soft.  ?   Tenderness: There is no abdominal tenderness. There  is no guarding or rebound.  ?Musculoskeletal:     ?   General: Normal range of motion.  ?   Cervical back: Normal range of motion.  ?Lymphadenopathy:  ?   Cervical: No cervical adenopathy.  ?Neurological:  ?   General: No focal deficit present.  ?   Mental Status: She is alert and oriented to person, place, and time.  ?   Cranial Nerves: No cranial nerve deficit.  ?Skin: ?   General: Skin is warm and dry.  ?Psychiatric:     ?   Mood and Affect: Mood normal.     ?   Behavior: Behavior normal.     ?   Thought Content: Thought content normal.     ?   Judgment: Judgment normal.  ?Vitals reviewed.  ? ? ?Results: ?Results for orders placed or performed in visit on 07/30/21 (from the past 24 hour(s))  ?POCT Urinalysis Dipstick     Status: Abnormal  ? Collection Time: 07/30/21 11:36 AM  ?Result Value Ref Range  ? Color, UA amber   ? Clarity, UA clear   ? Glucose, UA Negative Negative  ? Bilirubin, UA neg   ? Ketones, UA neg   ? Spec Grav, UA 1.025 1.010 - 1.025  ? Blood, UA trace   ? pH, UA 6.0 5.0 - 8.0  ? Protein, UA Negative Negative  ? Urobilinogen, UA    ? Nitrite, UA pos   ? Leukocytes, UA Moderate (2+) (A) Negative  ? Appearance    ? Odor pos   ? ? ?Assessment/Plan: ?Encounter for annual routine gynecological examination ? ?Cervical cancer screening - Plan: Cytology -  PAP ? ?Screening for STD (sexually transmitted disease) - Plan: Cytology - PAP ? ?Encounter for surveillance of implantable subdermal contraceptive--due for removal 12/24 ? ?Acute cystitis without hematuria - Plan:

## 2021-07-30 ENCOUNTER — Encounter: Payer: Self-pay | Admitting: Obstetrics and Gynecology

## 2021-07-30 ENCOUNTER — Other Ambulatory Visit (HOSPITAL_COMMUNITY)
Admission: RE | Admit: 2021-07-30 | Discharge: 2021-07-30 | Disposition: A | Discharge status: Home / Self Care | Payer: Medicaid Other | Source: Ambulatory Visit | Attending: Obstetrics and Gynecology | Admitting: Obstetrics and Gynecology

## 2021-07-30 ENCOUNTER — Ambulatory Visit (INDEPENDENT_AMBULATORY_CARE_PROVIDER_SITE_OTHER): Payer: Medicaid Other | Admitting: Obstetrics and Gynecology

## 2021-07-30 VITALS — BP 126/90 | Ht 63.0 in | Wt 255.0 lb

## 2021-07-30 DIAGNOSIS — Z01419 Encounter for gynecological examination (general) (routine) without abnormal findings: Secondary | ICD-10-CM | POA: Diagnosis not present

## 2021-07-30 DIAGNOSIS — N3000 Acute cystitis without hematuria: Secondary | ICD-10-CM

## 2021-07-30 DIAGNOSIS — Z124 Encounter for screening for malignant neoplasm of cervix: Secondary | ICD-10-CM

## 2021-07-30 DIAGNOSIS — Z113 Encounter for screening for infections with a predominantly sexual mode of transmission: Secondary | ICD-10-CM

## 2021-07-30 DIAGNOSIS — Z3046 Encounter for surveillance of implantable subdermal contraceptive: Secondary | ICD-10-CM

## 2021-07-30 LAB — POCT URINALYSIS DIPSTICK
Bilirubin, UA: NEGATIVE
Glucose, UA: NEGATIVE
Ketones, UA: NEGATIVE
Nitrite, UA: POSITIVE
Odor: POSITIVE
Protein, UA: NEGATIVE
Spec Grav, UA: 1.025 (ref 1.010–1.025)
pH, UA: 6 (ref 5.0–8.0)

## 2021-07-30 MED ORDER — NITROFURANTOIN MONOHYD MACRO 100 MG PO CAPS: 100.0000 mg | ORAL_CAPSULE | Freq: Two times a day (BID) | ORAL | 0 refills | Status: AC

## 2021-07-30 NOTE — Patient Instructions (Signed)
I value your feedback and you entrusting us with your care. If you get a [place] patient survey, I would appreciate you taking the time to let us know about your experience today. Thank you! ? ? ?

## 2021-08-02 LAB — URINE CULTURE

## 2021-08-02 LAB — CYTOLOGY - PAP
Chlamydia: NEGATIVE
Comment: NEGATIVE
Comment: NEGATIVE
Comment: NORMAL
Diagnosis: UNDETERMINED — AB
High risk HPV: NEGATIVE
Neisseria Gonorrhea: NEGATIVE

## 2022-02-26 ENCOUNTER — Encounter: Payer: Self-pay | Admitting: Emergency Medicine

## 2022-02-26 ENCOUNTER — Other Ambulatory Visit: Payer: Self-pay

## 2022-02-26 ENCOUNTER — Emergency Department
Admission: EM | Admit: 2022-02-26 | Discharge: 2022-02-26 | Disposition: A | Discharge status: Home / Self Care | Payer: Medicaid Other | Attending: Emergency Medicine | Admitting: Emergency Medicine

## 2022-02-26 DIAGNOSIS — K529 Noninfective gastroenteritis and colitis, unspecified: Secondary | ICD-10-CM | POA: Insufficient documentation

## 2022-02-26 LAB — COMPREHENSIVE METABOLIC PANEL
ALT: 18 U/L (ref 0–44)
AST: 18 U/L (ref 15–41)
Albumin: 4.3 g/dL (ref 3.5–5.0)
Alkaline Phosphatase: 67 U/L (ref 38–126)
Anion gap: 8 (ref 5–15)
BUN: 16 mg/dL (ref 6–20)
CO2: 21 mmol/L — ABNORMAL LOW (ref 22–32)
Calcium: 9.4 mg/dL (ref 8.9–10.3)
Chloride: 109 mmol/L (ref 98–111)
Creatinine, Ser: 0.98 mg/dL (ref 0.44–1.00)
GFR, Estimated: >60 mL/min (ref >60)
Glucose, Bld: 95 mg/dL (ref 70–99)
Potassium: 3.9 mmol/L (ref 3.5–5.1)
Sodium: 138 mmol/L (ref 135–145)
Total Bilirubin: 1.1 mg/dL (ref 0.3–1.2)
Total Protein: 8.6 g/dL — ABNORMAL HIGH (ref 6.5–8.1)

## 2022-02-26 LAB — CBC
HCT: 44.2 % (ref 36.0–46.0)
Hemoglobin: 14.5 g/dL (ref 12.0–15.0)
MCH: 27.4 pg (ref 26.0–34.0)
MCHC: 32.8 g/dL (ref 30.0–36.0)
MCV: 83.4 fL (ref 80.0–100.0)
Platelets: 240 10*3/uL (ref 150–400)
RBC: 5.3 MIL/uL — ABNORMAL HIGH (ref 3.87–5.11)
RDW: 13.4 % (ref 11.5–15.5)
WBC: 9 10*3/uL (ref 4.0–10.5)
nRBC: 0 % (ref 0.0–0.2)

## 2022-02-26 LAB — GASTROINTESTINAL PANEL BY PCR, STOOL (REPLACES STOOL CULTURE)

## 2022-02-26 LAB — LIPASE, BLOOD: Lipase: 39 U/L (ref 11–51)

## 2022-02-26 MED ORDER — SODIUM CHLORIDE 0.9 % IV BOLUS
1000.0000 mL | Freq: Once | INTRAVENOUS | Status: AC
  Administered 2022-02-26: 1000 mL via INTRAVENOUS

## 2022-02-26 MED ORDER — ACETAMINOPHEN 325 MG PO TABS
650.0000 mg | ORAL_TABLET | Freq: Once | ORAL | Status: AC
  Administered 2022-02-26: 650 mg via ORAL
  Filled 2022-02-26: qty 2

## 2022-02-26 NOTE — ED Provider Notes (Signed)
[facility] Provider Note    Event Date/Time   First MD Initiated Contact with Patient 02/26/22 1102     (approximate)   History   Diarrhea   HPI  Sarah Oneill is a 21 y.o. female who presents today for evaluation of abdominal cramping and diarrhea.  Patient reports that this began at 12:30 PM yesterday after eating a salad.  She denies specific abdominal pain, only cramps right before she had an episode of diarrhea, and that her abdominal pain improves temporarily.  She denies any unilateral pain.  She denies any blood in her stool.  She has not had any nausea or vomiting.  She denies any recent travel, or recent swimming in lakes, ponds, rivers.  She has not been on any antibiotics recently.  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   S/P cesarean section 03/13/2020   Elevated blood pressure affecting pregnancy in third trimester, antepartum 03/11/2020   Obesity affecting pregnancy 03/07/2020   HSV (herpes simplex virus) anogenital infection 10/10/2019   ADHD (attention deficit hyperactivity disorder) 08/02/2019   Bee sting allergy 08/02/2019   Shellfish allergy 08/02/2019   Recurrent UTI 01/29/2018   Chronic constipation 10/20/2017   Anxiety state 07/18/2015   Astigmatism 08/20/2011   BMI (body mass index), pediatric, greater than or equal to 95% for age 59/04/2011   Insulin resistance 08/20/2011          Physical Exam   Triage Vital Signs: ED Triage Vitals [02/26/22 1030]  Enc Vitals Group     BP 128/82     Pulse Rate 100     Resp 18     Temp 98.4 F (36.9 C)     Temp Source Oral     SpO2 100 %     Weight 251 lb (113.9 kg)     Height 5\' 3"  (1.6 m)     Head Circumference      Peak Flow      Pain Score 6     Pain Loc      Pain  Edu?      Excl. in GC?     Most recent vital signs: Vitals:   02/26/22 1030 02/26/22 1448  BP: 128/82 (!) 136/93  Pulse: 100 89  Resp: 18 18  Temp: 98.4 F (36.9 C)   SpO2: 100% 100%    Physical Exam Vitals and nursing note reviewed.  Constitutional:      General: Awake and alert. No acute distress.    Appearance: Normal appearance. The patient is overweight.  HENT:     Head: Normocephalic and atraumatic.     Mouth: Mucous membranes are moist.  Eyes:     General: PERRL. Normal EOMs        Right eye: No discharge.        Left eye: No discharge.     Conjunctiva/sclera: Conjunctivae normal.  Cardiovascular:     Rate and Rhythm: Normal rate and regular rhythm.     Pulses: Normal pulses.  Pulmonary:     Effort: Pulmonary effort is normal. No respiratory distress.     Breath sounds: Normal breath sounds.  Abdominal:     Abdomen is soft. There is no abdominal tenderness. No rebound or guarding. No distention. Musculoskeletal:  General: No swelling. Normal range of motion.     Cervical back: Normal range of motion and neck supple.  Skin:    General: Skin is warm and dry.     Capillary Refill: Capillary refill takes less than 2 seconds.     Findings: No rash.  Neurological:     Mental Status: The patient is awake and alert.      ED Results / Procedures / Treatments   Labs (all labs ordered are listed, but only abnormal results are displayed) Labs Reviewed  COMPREHENSIVE METABOLIC PANEL - Abnormal; Notable for the following components:      Result Value   CO2 21 (*)    Total Protein 8.6 (*)    All other components within normal limits  CBC - Abnormal; Notable for the following components:   RBC 5.30 (*)    All other components within normal limits  GASTROINTESTINAL PANEL BY PCR, STOOL (REPLACES STOOL CULTURE)  LIPASE, BLOOD  URINALYSIS, ROUTINE W REFLEX MICROSCOPIC  POC URINE PREG, ED     EKG     RADIOLOGY     PROCEDURES:  Critical Care  performed:   Procedures   MEDICATIONS ORDERED IN ED: Medications  sodium chloride 0.9 % bolus 1,000 mL (1,000 mLs Intravenous New Bag/Given 02/26/22 1136)  acetaminophen (TYLENOL) tablet 650 mg (650 mg Oral Given 02/26/22 1136)     IMPRESSION / MDM / ASSESSMENT AND PLAN / ED COURSE  I reviewed the triage vital signs and the nursing notes.   Differential diagnosis includes, but is not limited to, gastroenteritis, norovirus, less likely appendicitis, diverticulitis.  Patient is awake and alert, hemodynamically stable and afebrile. Patient is overall well-appearing. No abdominal pain or tenderness. Vomiting and diarrhea are non-bloody. Patient is hemodynamically stable. No history of immunosuppression, no other red flags such as recent travel, sick contacts or recent antibiotic use. Improved with treatment in the emergency department and tolerating oral intake. Differential diagnosis is broad however  without focal abdominal tenderness and given improvement, no concern that the patient requires urgent imaging or has surgical process in abdomen. Given that patient has a paucity of red flags for the vomiting and diarrhea as it pertains to patient's past medical history and history of present illness, no indication for further observation or empiric antibiotic treatment.  Labs are overall reassuring.  Upon reevaluation, patient reports that she feels significantly improved and is requesting food.  No reproducible abdominal tenderness on repeat abdominal exam.  Discussed care plan, return precautions, and advised close outpatient follow-up. Patient agrees with plan of care.   Patient's presentation is most consistent with acute complicated illness / injury requiring diagnostic workup.     FINAL CLINICAL IMPRESSION(S) / ED DIAGNOSES   Final diagnoses:  Gastroenteritis     Rx / DC Orders   ED Discharge Orders     None        Note:  This document was prepared using Dragon voice  recognition software and may include unintentional dictation errors.   Keturah Shavers 02/26/22 1450    Minna Antis, MD 02/26/22 1454

## 2022-02-26 NOTE — Discharge Instructions (Signed)
Please return for any new, worsening, or change in symptoms or other concerns including abdominal pain, bloody stool, fever, or any other concerns.  It was a pleasure caring for you today.

## 2022-02-26 NOTE — ED Triage Notes (Signed)
Patient to ED for diarrhea since yesterday. Patient states that this started after eating a salad at school yesterday. Patient states the stools are watery. Also having generalized abd pain/cramping.

## 2022-07-15 ENCOUNTER — Ambulatory Visit: Payer: Self-pay | Admitting: Obstetrics

## 2022-07-17 ENCOUNTER — Ambulatory Visit (LOCAL_COMMUNITY_HEALTH_CENTER): Payer: Self-pay | Admitting: Family Medicine

## 2022-07-17 ENCOUNTER — Ambulatory Visit: Payer: Self-pay

## 2022-07-17 VITALS — BP 129/91 | HR 83 | Ht 63.0 in | Wt 268.4 lb

## 2022-07-17 DIAGNOSIS — Z3046 Encounter for surveillance of implantable subdermal contraceptive: Secondary | ICD-10-CM

## 2022-07-17 DIAGNOSIS — Z309 Encounter for contraceptive management, unspecified: Secondary | ICD-10-CM

## 2022-07-17 DIAGNOSIS — Z01419 Encounter for gynecological examination (general) (routine) without abnormal findings: Secondary | ICD-10-CM

## 2022-07-17 DIAGNOSIS — Z98891 History of uterine scar from previous surgery: Secondary | ICD-10-CM | POA: Insufficient documentation

## 2022-07-17 LAB — WET PREP FOR TRICH, YEAST, CLUE
Trichomonas Exam: NEGATIVE
Yeast Exam: NEGATIVE

## 2022-07-17 NOTE — Progress Notes (Signed)
Pt appointment for PE, Pap, wet prep, and Nexplanon removal. Pt seen by FNP Lowella Petties. Family planning packet given and contents reviewed. Wet prep results negative and reviewed with pt. Educational materials on achieving pregnancy provided.

## 2022-07-17 NOTE — Progress Notes (Signed)
[facility] Number: [phone]    Family Planning Visit- Initial Visit  Subjective:  Sarah Oneill is a 22 y.o.  G1P1001   being seen today for an initial annual visit and to discuss reproductive life planning.  The patient is currently using Hormonal Implant for pregnancy prevention. Patient reports she/her/hers  does want a pregnancy in the next year.    she/her/hers report they are looking for a method that provides Other wants to become pregnant   Patient has the following medical conditions has Anxiety state; ADHD (attention deficit hyperactivity disorder); Astigmatism; Bee sting allergy; Chronic constipation; Insulin resistance; Recurrent UTI; Shellfish allergy; HSV (herpes simplex virus) anogenital infection; and History of cesarean section on their problem list.  Chief Complaint  Patient presents with   Contraception    PE, Nexplanon removal. Pregnancy seeking.     Patient reports to clinic for nexplanon removal and PE with pap smear.   Body mass index is 47.54 kg/m. - Patient is eligible for diabetes screening based on BMI> 25 and age >35?  no HA1C ordered? not applicable  Patient reports 1  partner/s in last year. Desires STI screening?  No - declined  Has patient been screened once for HCV in the past?  Yes  No results found for: "HCVAB"  Does the patient have current drug use (including MJ), have a partner with drug use, and/or has been incarcerated since last result? No  If yes-- Screen for HCV through [facility]   Does the patient meet criteria for HBV testing? No  Criteria:  -Household, sexual or needle sharing contact with HBV -History of drug use -HIV positive -Those with known Hep C   Health Maintenance Due  Topic Date Due   HPV VACCINES (1 - 2-dose series) Never done   Hepatitis C Screening  Never done   INFLUENZA VACCINE  Never done   COVID-19 Vaccine (2 - 2023-24  season) 12/20/2021   PAP SMEAR-Modifier  07/31/2022    Review of Systems  Constitutional:  Negative for weight loss.  Eyes:  Negative for blurred vision.  Respiratory:  Negative for cough and shortness of breath.   Cardiovascular:  Negative for claudication.  Gastrointestinal:  Positive for nausea.  Genitourinary:  Negative for dysuria and frequency.  Skin:  Negative for rash.  Neurological:  Positive for headaches.  Endo/Heme/Allergies:  Does not bruise/bleed easily.    The following portions of the patient's history were reviewed and updated as appropriate: allergies, current medications, past family history, past medical history, past social history, past surgical history and problem list. Problem list updated.   See flowsheet for other program required questions.  Objective:   Vitals:   07/17/22 0831  BP: (!) 129/91  Pulse: 83  Weight: 268 lb 6.4 oz (121.7 kg)  Height: 5\' 3"  (1.6 m)    Physical Exam Vitals and nursing note reviewed.  Constitutional:      Appearance: Normal appearance.  HENT:     Head: Normocephalic and atraumatic.     Mouth/Throat:     Mouth: Mucous membranes are moist.     Pharynx: Oropharynx is clear. No oropharyngeal exudate or posterior oropharyngeal erythema.  Pulmonary:     Effort: Pulmonary effort is normal.  Abdominal:     General: Abdomen is flat.     Palpations: There is no mass.     Tenderness: There is no abdominal tenderness. There is no rebound.  Genitourinary:  General: Normal vulva.     Exam position: Lithotomy position.     Pubic Area: No rash or pubic lice.      Tanner stage (genital): 5.     Labia:        Right: No rash or lesion.        Left: No rash or lesion.      Vagina: Normal. No vaginal discharge, erythema, bleeding or lesions.     Cervix: Friability and eversion present. No cervical motion tenderness, discharge, lesion or erythema.     Uterus: Normal.      Adnexa: Right adnexa normal and left adnexa normal.      Rectum: Normal.        Comments: pH = 4  Mild amt of eversion vs lesion at 6 o'clock Lymphadenopathy:     Head:     Right side of head: No preauricular or posterior auricular adenopathy.     Left side of head: No preauricular or posterior auricular adenopathy.     Cervical: No cervical adenopathy.     Upper Body:     Right upper body: No supraclavicular, axillary or epitrochlear adenopathy.     Left upper body: No supraclavicular, axillary or epitrochlear adenopathy.     Lower Body: No right inguinal adenopathy. No left inguinal adenopathy.  Skin:    General: Skin is warm and dry.     Findings: No rash.  Neurological:     Mental Status: She is alert and oriented to person, place, and time.    Assessment and Plan:  Sarah Oneill is a 23 y.o. female presenting to the [facility] Department for an initial annual wellness/contraceptive visit  Contraception counseling: Reviewed options based on patient desire and reproductive life plan. Patient is interested in Pregnant/Seeking Pregnancy. This was provided to the patient today.   Risks, benefits, and typical effectiveness rates were reviewed.  Questions were answered.  Written information was also given to the patient to review.    The patient will follow up in  1 years for surveillance.  The patient was told to call with any further questions, or with any concerns about this method of contraception.  Emphasized use of condoms 100% of the time for STI prevention.  Need for ECP was assessed. Not indicated- patient has nexplanon in place.   1. Well woman exam with routine gynecological exam -CBE not indicated until 25 per ACOG guidelines -patient endorses HA, weight gain, nausea and vaginal discharge  -pt states HA and nausea are due to migraines - usually tx with ibuprofen and this goes away -vaginal discharge- pt has has this on and off for years- testing today for BV and yeast -encouraged visiting dentist and PCP, list  and resources given -PHQ-9 of 4, denied SI  - IGP, rfx Aptima HPV ASCU - WET PREP FOR [place], YEAST, CLUE 2. Morbid obesity ([place]) -pt states weight gain due to nexplanon- hopes to loose some weight once it is removed  3. Encounter for Nexplanon removal Nexplanon Removal Patient identified, informed consent performed, consent signed.   Appropriate time out taken. Nexplanon site identified.  Area prepped in usual sterile fashon. 3 ml of 1% lidocaine with Epinephrine was used to anesthetize the area at the distal end of the implant and along implant site. A small stab incision was made right beside the implant on the distal portion.  The Nexplanon rod was grasped using curved hemostats/manual and removed without difficulty.  There was minimal blood loss.  There were no complications.  Steri-strips were applied over the small incision.  A pressure bandage was applied to reduce any bruising.  The patient tolerated the procedure well and was given post procedure instructions.    Nexplanon:   Counseled patient to take OTC analgesic starting as soon as lidocaine starts to wear off and take regularly for at least 48 hr to decrease discomfort.  Specifically to take with food or milk to decrease stomach upset and for IB 600 mg (3 tablets) every 6 hrs; IB 800 mg (4 tablets) every 8 hrs; or Aleve 2 tablets every 12 hrs.    Return in about 1 year (around 07/17/2023), or if symptoms worsen or fail to improve.  No future appointments.  Sharlet Salina, [place]

## 2022-07-25 LAB — IGP, RFX APTIMA HPV ASCU: PAP Smear Comment: 0

## 2022-08-16 ENCOUNTER — Emergency Department: Payer: Self-pay

## 2022-08-16 ENCOUNTER — Other Ambulatory Visit: Payer: Self-pay

## 2022-08-16 ENCOUNTER — Emergency Department
Admission: EM | Admit: 2022-08-16 | Discharge: 2022-08-16 | Disposition: A | Discharge status: Home / Self Care | Payer: Self-pay | Attending: Emergency Medicine | Admitting: Emergency Medicine

## 2022-08-16 DIAGNOSIS — J45901 Unspecified asthma with (acute) exacerbation: Secondary | ICD-10-CM | POA: Insufficient documentation

## 2022-08-16 DIAGNOSIS — J069 Acute upper respiratory infection, unspecified: Secondary | ICD-10-CM | POA: Insufficient documentation

## 2022-08-16 DIAGNOSIS — I10 Essential (primary) hypertension: Secondary | ICD-10-CM | POA: Insufficient documentation

## 2022-08-16 DIAGNOSIS — Z1152 Encounter for screening for COVID-19: Secondary | ICD-10-CM | POA: Insufficient documentation

## 2022-08-16 LAB — CBC WITH DIFFERENTIAL/PLATELET
Abs Immature Granulocytes: 0.01 10*3/uL (ref 0.00–0.07)
Basophils Absolute: 0 10*3/uL (ref 0.0–0.1)
Basophils Relative: 1 %
Eosinophils Absolute: 0.2 10*3/uL (ref 0.0–0.5)
Eosinophils Relative: 3 %
HCT: 41 % (ref 36.0–46.0)
Hemoglobin: 13.4 g/dL (ref 12.0–15.0)
Immature Granulocytes: 0 %
Lymphocytes Relative: 35 %
Lymphs Abs: 2.2 10*3/uL (ref 0.7–4.0)
MCH: 28.1 pg (ref 26.0–34.0)
MCHC: 32.7 g/dL (ref 30.0–36.0)
MCV: 86 fL (ref 80.0–100.0)
Monocytes Absolute: 0.5 10*3/uL (ref 0.1–1.0)
Monocytes Relative: 7 %
Neutro Abs: 3.5 10*3/uL (ref 1.7–7.7)
Neutrophils Relative %: 54 %
Platelets: 261 10*3/uL (ref 150–400)
RBC: 4.77 MIL/uL (ref 3.87–5.11)
RDW: 13.3 % (ref 11.5–15.5)
WBC: 6.3 10*3/uL (ref 4.0–10.5)
nRBC: 0 % (ref 0.0–0.2)

## 2022-08-16 LAB — BASIC METABOLIC PANEL
Anion gap: 6 (ref 5–15)
BUN: 11 mg/dL (ref 6–20)
CO2: 28 mmol/L (ref 22–32)
Calcium: 8.6 mg/dL — ABNORMAL LOW (ref 8.9–10.3)
Chloride: 101 mmol/L (ref 98–111)
Creatinine, Ser: 0.92 mg/dL (ref 0.44–1.00)
GFR, Estimated: >60 mL/min (ref >60)
Glucose, Bld: 90 mg/dL (ref 70–99)
Potassium: 3.9 mmol/L (ref 3.5–5.1)
Sodium: 135 mmol/L (ref 135–145)

## 2022-08-16 LAB — SARS CORONAVIRUS 2 BY RT PCR: SARS Coronavirus 2 by RT PCR: NEGATIVE

## 2022-08-16 LAB — POC URINE PREG, ED: Preg Test, Ur: NEGATIVE

## 2022-08-16 LAB — TROPONIN I (HIGH SENSITIVITY): Troponin I (High Sensitivity): <2 ng/L (ref <18)

## 2022-08-16 MED ORDER — ALBUTEROL SULFATE HFA 108 (90 BASE) MCG/ACT IN AERS: 2.0000 | INHALATION_SPRAY | Freq: Four times a day (QID) | RESPIRATORY_TRACT | 2 refills | Status: AC | PRN

## 2022-08-16 MED ORDER — IPRATROPIUM-ALBUTEROL 0.5-2.5 (3) MG/3ML IN SOLN
3.0000 mL | Freq: Once | RESPIRATORY_TRACT | Status: AC
  Administered 2022-08-16: 3 mL via RESPIRATORY_TRACT
  Filled 2022-08-16: qty 3

## 2022-08-16 NOTE — ED Triage Notes (Signed)
Pt to ED POV for cough since Wednesday with SOB and "something in my chest" like burning. States has been coughing all day today and yesterday and cannot stop coughing.   States hx asthma and has been wheezing, doe snot have inhaler.

## 2022-08-16 NOTE — ED Provider Notes (Signed)
[facility] Provider Note    Event Date/Time   First MD Initiated Contact with Patient 08/16/22 1341     (approximate)  History   Chief Complaint: Cough and Shortness of Breath  HPI  Sarah Oneill is a 22 y.o. female with a past medical history of anemia, anxiety, hypertension, presents to the emergency department for chest pain cough congestion and wheeze.  According to the patient for the past 5 days or so she has been coughing and feeling a discomfort in the center of her chest.  States she is felt tightness in the chest and has been wheezing at times.  States she has been diagnosed with asthma when she was younger but does not currently use an inhaler of any type.  Patient denies any known fever.  States she was having nasal congestion but that has largely resolved.  Physical Exam   Triage Vital Signs: ED Triage Vitals  Enc Vitals Group     BP 08/16/22 1349 128/86     Pulse Rate 08/16/22 1349 95     Resp 08/16/22 1349 17     Temp 08/16/22 1349 98.8 F (37.1 C)     Temp Source 08/16/22 1349 Oral     SpO2 08/16/22 1349 100 %     Weight 08/16/22 1314 265 lb (120.2 kg)     Height 08/16/22 1314 5\' 3"  (1.6 m)     Head Circumference --      Peak Flow --      Pain Score 08/16/22 1314 6     Pain Loc --      Pain Edu? --      Excl. in GC? --     Most recent vital signs: Vitals:   08/16/22 1349  BP: 128/86  Pulse: 95  Resp: 17  Temp: 98.8 F (37.1 C)  SpO2: 100%    General: Awake, no distress.  Occasional cough during exam. CV:  Good peripheral perfusion.  Regular rate and rhythm  Resp:  Normal effort.  Equal breath sounds bilaterally.  Mild expiratory wheeze bilaterally. Abd:  No distention.  Soft, nontender.  No rebound or guarding.  ED Results / Procedures / Treatments   EKG  EKG viewed and interpreted by myself shows normal sinus rhythm at 98 bpm with a narrow QRS, normal axis, normal intervals, no concerning ST  changes.  RADIOLOGY  I have reviewed and interpreted the chest x-ray images.  No consolidation seen on my evaluation. Radiology has read the x-ray as negative.   MEDICATIONS ORDERED IN ED: Medications  ipratropium-albuterol (DUONEB) 0.5-2.5 (3) MG/3ML nebulizer solution 3 mL (has no administration in time range)     IMPRESSION / MDM / ASSESSMENT AND PLAN / ED COURSE  I reviewed the triage vital signs and the nursing notes.  Patient's presentation is most consistent with acute presentation with potential threat to life or bodily function.  Patient presents emergency department for cough congestion wheeze ongoing for the past 5 days.  Vital signs reassuring occluding 100% room air saturation, afebrile.  On exam patient does have a slight expiratory wheeze with an occasional cough during exam as well.  Highly suspect more of a viral upper respiratory infection possibly causing a reactive airway exacerbation.  We will dose a DuoNeb in the emergency department we will check labs including a cardiac enzyme and an EKG given the patient's complaint of intermittent chest pain.  Will also obtain a chest x-ray to rule out pneumonia.  Will  obtain a COVID swab to rule out COVID infection as a cause of her upper respiratory symptoms.  Patient's workup is overall reassuring, chest x-ray is clear, EKG reassuring.  Patient's lab results show normal CBC with a normal white blood cell count, reassuring chemistry with normal renal function negative troponin.  COVID test is negative.  Pregnancy test negative.  Highly suspect more of a viral URI possibly leading to some asthma exacerbation.  Will prescribe an albuterol inhaler in addition to supportive care.  Patient agreeable to plan of care.  FINAL CLINICAL IMPRESSION(S) / ED DIAGNOSES   Upper respiratory infection Asthma exacerbation  Rx / DC Orders   Albuterol  Note:  This document was prepared using Dragon voice recognition software and may include  unintentional dictation errors.   Minna Antis, MD 08/16/22 [phone]

## 2022-08-16 NOTE — ED Notes (Signed)
Pt verbalizes understanding of discharge instructions. Opportunity for questioning and answers were provided. Pt discharged from ED to home.   ? ?

## 2023-01-05 ENCOUNTER — Ambulatory Visit
Admission: EM | Admit: 2023-01-05 | Discharge: 2023-01-05 | Disposition: A | Discharge status: Home / Self Care | Payer: Self-pay | Attending: Emergency Medicine | Admitting: Emergency Medicine

## 2023-01-05 DIAGNOSIS — Z113 Encounter for screening for infections with a predominantly sexual mode of transmission: Secondary | ICD-10-CM

## 2023-01-05 DIAGNOSIS — N898 Other specified noninflammatory disorders of vagina: Secondary | ICD-10-CM

## 2023-01-05 DIAGNOSIS — Z3202 Encounter for pregnancy test, result negative: Secondary | ICD-10-CM | POA: Insufficient documentation

## 2023-01-05 DIAGNOSIS — N390 Urinary tract infection, site not specified: Secondary | ICD-10-CM

## 2023-01-05 LAB — POCT URINALYSIS DIP (MANUAL ENTRY)
Bilirubin, UA: NEGATIVE
Glucose, UA: NEGATIVE mg/dL
Ketones, POC UA: NEGATIVE mg/dL
Nitrite, UA: POSITIVE — AB
Protein Ur, POC: 30 mg/dL — AB
Spec Grav, UA: 1.025 (ref 1.010–1.025)
Urobilinogen, UA: 0.2 U/dL
pH, UA: 6 (ref 5.0–8.0)

## 2023-01-05 LAB — POCT URINE PREGNANCY: Preg Test, Ur: NEGATIVE

## 2023-01-05 MED ORDER — CEPHALEXIN 500 MG PO CAPS: 500.0000 mg | ORAL_CAPSULE | Freq: Two times a day (BID) | ORAL | 0 refills | Status: AC

## 2023-01-05 NOTE — ED Provider Notes (Signed)
Sarah Oneill    CSN: [id number] Arrival date & time: 01/05/23  1302      History   Chief Complaint Chief Complaint  Patient presents with   SEXUALLY TRANSMITTED DISEASE    HPI Sarah Oneill is a 22 y.o. female.  Patient presents with vaginal discharge x 1 week.  She request STD testing.  Her urine is cloudy and malodorous.  She denies fever, chills, rash, pelvic pain, dysuria, hematuria, abdominal pain, or other symptoms.  No OTC medications today.  Her medical history includes recurrent UTI.  Patient was seen at [facility] ED on 12/04/2022; diagnosed with diarrhea, insulin resistance, prediabetes; she was treated with Macrobid and Zofran.  The history is provided by the patient and medical records.    Past Medical History:  Diagnosis Date   Anemia    Anxiety    Asthma    Hypertension    Vitamin D deficiency 04/19/2014    Patient Active Problem List   Diagnosis Date Noted   History of cesarean section 07/17/2022   Morbid obesity (HCC) 07/17/2022   HSV (herpes simplex virus) anogenital infection 10/10/2019   ADHD (attention deficit hyperactivity disorder) 08/02/2019   Bee sting allergy 08/02/2019   Shellfish allergy 08/02/2019   Recurrent UTI 01/29/2018   Chronic constipation 10/20/2017   Anxiety state 07/18/2015   Astigmatism 08/20/2011   Insulin resistance 08/20/2011    Past Surgical History:  Procedure Laterality Date   CESAREAN SECTION  03/13/2020   Procedure: CESAREAN SECTION;  Surgeon: Nadara Mustard, MD;  Location: [facility] ORS;  Service: Obstetrics;;   HERNIA REPAIR     [facility]   TONSILLECTOMY AND ADENOIDECTOMY N/A 08/30/2014   Procedure: TONSILLECTOMY AND ADENOIDECTOMY;  Surgeon: Bud Face, MD;  Location: [facility];  Service: ENT;  Laterality: N/A;  adenoids cauterized no tissue sent to lab   WISDOM TOOTH EXTRACTION      OB History     Gravida  1   Para  1   Term  1   Preterm  0   AB  0   Living  1      SAB  0   IAB  0    Ectopic  0   Multiple  0   Live Births  1            Home Medications    Prior to Admission medications   Medication Sig Start Date End Date Taking? Authorizing Provider  cephALEXin (KEFLEX) 500 MG capsule Take 1 capsule (500 mg total) by mouth 2 (two) times daily for 5 days. 01/05/23 01/10/23 Yes Mickie Bail, NP  albuterol (VENTOLIN HFA) 108 (90 Base) MCG/ACT inhaler Inhale 2 puffs into the lungs every 6 (six) hours as needed for wheezing or shortness of breath. 08/16/22   Minna Antis, MD  EPINEPHrine 0.3 mg/0.3 mL IJ SOAJ injection Inject 0.3 mg into the muscle as needed. For Bee stings    [provider]  etonogestrel (NEXPLANON) 68 MG IMPL implant 1 each by Subdermal route once. 04/19/20 04/20/23  [provider]    Family History Family History  Problem Relation Age of Onset   Seizures Mother    Bipolar disorder Mother    ADD / ADHD Mother    Anxiety disorder Mother    Migraines Mother    Migraines Father    Diabetes Father    Hypertension Father    Bipolar disorder Sister    ADD / ADHD Sister    Migraines  Sister    Depression Maternal Grandmother    Migraines Maternal Grandmother    Hypertension Maternal Grandmother    Asthma Maternal Grandmother    Anxiety disorder Maternal Grandmother    Hearing loss Maternal Grandmother    Suicidality Maternal Grandfather    Hypertension Paternal Grandmother    Bipolar disorder Paternal Grandmother    Migraines Paternal Grandmother     Social History Social History   Tobacco Use   Smoking status: Never    Passive exposure: Yes   Smokeless tobacco: Never  Vaping Use   Vaping status: Never Used  Substance Use Topics   Alcohol use: No   Drug use: No     Allergies   Augmentin [amoxicillin-pot clavulanate], Bee venom, and Shellfish allergy   Review of Systems Review of Systems  Constitutional:  Negative for chills and fever.  Gastrointestinal:  Negative for abdominal pain, diarrhea,  nausea and vomiting.  Genitourinary:  Positive for vaginal discharge. Negative for dysuria, flank pain, frequency, hematuria and pelvic pain.       Cloudy malodorous urine  Skin:  Negative for color change and rash.     Physical Exam Triage Vital Signs ED Triage Vitals  Encounter Vitals Group     BP 01/05/23 1333 132/87     Systolic BP Percentile --      Diastolic BP Percentile --      Pulse Rate 01/05/23 1333 94     Resp 01/05/23 1333 18     Temp 01/05/23 1333 99.1 F (37.3 C)     Temp src --      SpO2 01/05/23 1333 95 %     Weight --      Height --      Head Circumference --      Peak Flow --      Pain Score 01/05/23 1331 0     Pain Loc --      Pain Education --      Exclude from Growth Chart --    No data found.  Updated Vital Signs BP 132/87   Pulse 94   Temp 99.1 F (37.3 C)   Resp 18   LMP 12/22/2022   SpO2 95%   Visual Acuity Right Eye Distance:   Left Eye Distance:   Bilateral Distance:    Right Eye Near:   Left Eye Near:    Bilateral Near:     Physical Exam Vitals and nursing note reviewed.  Constitutional:      General: She is not in acute distress.    Appearance: She is well-developed.  HENT:     Mouth/Throat:     Mouth: Mucous membranes are moist.  Cardiovascular:     Rate and Rhythm: Normal rate and regular rhythm.     Heart sounds: Normal heart sounds.  Pulmonary:     Effort: Pulmonary effort is normal. No respiratory distress.     Breath sounds: Normal breath sounds.  Abdominal:     General: Bowel sounds are normal.     Palpations: Abdomen is soft.     Tenderness: There is no abdominal tenderness. There is no right CVA tenderness, left CVA tenderness, guarding or rebound.  Musculoskeletal:     Cervical back: Neck supple.  Skin:    General: Skin is warm and dry.  Neurological:     Mental Status: She is alert.  Psychiatric:        Mood and Affect: Mood normal.        Behavior:  Behavior normal.      UC Treatments / Results   Labs (all labs ordered are listed, but only abnormal results are displayed) Labs Reviewed  POCT URINALYSIS DIP (MANUAL ENTRY) - Abnormal; Notable for the following components:      Result Value   Color, UA straw (*)    Clarity, UA cloudy (*)    Blood, UA moderate (*)    Protein Ur, POC =30 (*)    Nitrite, UA Positive (*)    Leukocytes, UA Small (1+) (*)    All other components within normal limits  URINE CULTURE  POCT URINE PREGNANCY  CERVICOVAGINAL ANCILLARY ONLY    EKG   Radiology No results found.  Procedures Procedures (including critical care time)  Medications Ordered in UC Medications - No data to display  Initial Impression / Assessment and Plan / UC Course  I have reviewed the triage vital signs and the nursing notes.  Pertinent labs & imaging results that were available during my care of the patient were reviewed by me and considered in my medical decision making (see chart for details).    UTI, vaginal discharge, STD screening, negative pregnancy test.  Treating with Keflex. Urine culture pending. Discussed with patient that we will call her if the urine culture shows the need to change or discontinue the antibiotic.  Patient obtained vaginal self swab for testing.  Discussed that we will call if test results are positive.  Discussed that she may require treatment at that time.  Instructed patient to abstain from sexual activity for at least 7 days.  Instructed her to follow-up with her PCP if her symptoms are not improving.  Patient agrees to plan of care.      Final Clinical Impressions(s) / UC Diagnoses   Final diagnoses:  Screening for STD (sexually transmitted disease)  Urinary tract infection without hematuria, site unspecified  Vaginal discharge  Negative pregnancy test     Discharge Instructions      Take the antibiotic as directed.  The urine culture is pending.  We will call you if it shows the need to change or discontinue your antibiotic.     Your vaginal tests are pending.  If your test results are positive, we will call you.  Do not have sexual activity for at least 7 days.    Follow up with your primary care provider.        ED Prescriptions     Medication Sig Dispense Auth. Provider   cephALEXin (KEFLEX) 500 MG capsule Take 1 capsule (500 mg total) by mouth 2 (two) times daily for 5 days. 10 capsule Mickie Bail, NP      PDMP not reviewed this encounter.   Mickie Bail, NP 01/05/23 1416

## 2023-01-05 NOTE — ED Triage Notes (Signed)
Pt presents with complaints of wanting to be checked out for STD's. Denies any symptoms.

## 2023-01-05 NOTE — Discharge Instructions (Addendum)
Take the antibiotic as directed.  The urine culture is pending.  We will call you if it shows the need to change or discontinue your antibiotic.    Your vaginal tests are pending.  If your test results are positive, we will call you.  Do not have sexual activity for at least 7 days.    Follow up with your primary care provider.

## 2023-01-06 LAB — CERVICOVAGINAL ANCILLARY ONLY
Bacterial Vaginitis (gardnerella): POSITIVE — AB
Candida Glabrata: NEGATIVE
Candida Vaginitis: POSITIVE — AB
Chlamydia: POSITIVE — AB
Comment: NEGATIVE
Comment: NEGATIVE
Comment: NEGATIVE
Comment: NEGATIVE
Comment: NEGATIVE
Comment: NORMAL
Neisseria Gonorrhea: POSITIVE — AB
Trichomonas: NEGATIVE

## 2023-01-06 LAB — URINE CULTURE

## 2023-01-07 ENCOUNTER — Telehealth: Payer: Self-pay

## 2023-01-07 DIAGNOSIS — N3000 Acute cystitis without hematuria: Secondary | ICD-10-CM | POA: Insufficient documentation

## 2023-01-07 MED ORDER — METRONIDAZOLE 500 MG PO TABS: 500.0000 mg | ORAL_TABLET | Freq: Two times a day (BID) | ORAL | 0 refills | Status: AC

## 2023-01-07 MED ORDER — AZITHROMYCIN 1 G PO PACK: 1.0000 g | PACK | Freq: Once | ORAL | Status: DC

## 2023-01-07 MED ORDER — AZITHROMYCIN 250 MG PO TABS: 1000.0000 mg | ORAL_TABLET | Freq: Once | ORAL | 0 refills | Status: AC

## 2023-01-07 MED ORDER — FLUCONAZOLE 150 MG PO TABS: 150.0000 mg | ORAL_TABLET | Freq: Once | ORAL | 0 refills | Status: AC

## 2023-01-07 NOTE — Telephone Encounter (Signed)
Pt will need to return to UC for Nurse Visit for injection of Rocephin 500mg  IM. Pt will also need to recollect urine culture per Helaine Chess,  PA-C.   Contacted patient by phone.  Verified identity using two identifiers.  Provided positive result.  Reviewed safe sex practices, notifying partners, and refraining from sexual activities for 7 days from time of treatment.  Patient verified understanding, all questions answered.    Per protocol, pt requires tx with metronidazole, Azithromycin, and Diflucan.  Reviewed with patient, verified pharmacy, prescription sent

## 2023-01-16 ENCOUNTER — Telehealth: Payer: Self-pay | Admitting: Emergency Medicine

## 2023-01-16 NOTE — Telephone Encounter (Signed)
Completed follow-up with patient regarding positive test results of gonorrhea chlamydia bacterial vaginosis and yeast, completed testing on 01/05/2023 and received notification on 01/07/2023, prescriptions for chlamydia bacterial vaginosis and yeast sent to the pharmacy, was instructed to return to clinic for treatment for gonorrhea with Rocephin, has yet to do so, patient endorses that she has not received treatment at any other clinic, has not picked up medicine from pharmacy due to finances, discussed with patient that she should go to the local health department to receive free treatment and was instructed to take test results with her, given phone number to schedule follow-up appointment

## 2023-01-19 ENCOUNTER — Ambulatory Visit
Admission: EM | Admit: 2023-01-19 | Discharge: 2023-01-19 | Disposition: A | Discharge status: Home / Self Care | Payer: Self-pay | Attending: Emergency Medicine | Admitting: Emergency Medicine

## 2023-01-19 DIAGNOSIS — A549 Gonococcal infection, unspecified: Secondary | ICD-10-CM | POA: Insufficient documentation

## 2023-01-19 MED ORDER — CEFTRIAXONE SODIUM 500 MG IJ SOLR
500.0000 mg | INTRAMUSCULAR | Status: DC
  Administered 2023-01-19: 500 mg via INTRAMUSCULAR

## 2023-01-19 NOTE — ED Triage Notes (Addendum)
Patient @ Urgent Care for nurse visit.  Here to receive 500mg  Rocephin IM d/t positive STD panel.   Urine culture also recollected per call back nurse note.

## 2023-03-08 ENCOUNTER — Other Ambulatory Visit: Payer: Self-pay

## 2023-03-08 ENCOUNTER — Emergency Department
Admission: EM | Admit: 2023-03-08 | Discharge: 2023-03-08 | Disposition: A | Discharge status: Home / Self Care | Payer: BC Managed Care – PPO | Attending: Emergency Medicine | Admitting: Emergency Medicine

## 2023-03-08 DIAGNOSIS — Z3200 Encounter for pregnancy test, result unknown: Secondary | ICD-10-CM | POA: Diagnosis present

## 2023-03-08 DIAGNOSIS — N760 Acute vaginitis: Secondary | ICD-10-CM | POA: Insufficient documentation

## 2023-03-08 DIAGNOSIS — B9689 Other specified bacterial agents as the cause of diseases classified elsewhere: Secondary | ICD-10-CM

## 2023-03-08 LAB — URINALYSIS, W/ REFLEX TO CULTURE (INFECTION SUSPECTED)
Bilirubin Urine: NEGATIVE
Glucose, UA: NEGATIVE mg/dL
Ketones, ur: NEGATIVE mg/dL
Nitrite: NEGATIVE
Protein, ur: NEGATIVE mg/dL
Specific Gravity, Urine: 1.024 (ref 1.005–1.030)
WBC, UA: >50 WBC/hpf (ref 0–5)
pH: 5 (ref 5.0–8.0)

## 2023-03-08 LAB — WET PREP, GENITAL
Sperm: NONE SEEN
Trich, Wet Prep: NONE SEEN
WBC, Wet Prep HPF POC: <10 (ref <10)
Yeast Wet Prep HPF POC: NONE SEEN

## 2023-03-08 LAB — CHLAMYDIA/NGC RT PCR (ARMC ONLY)
Chlamydia Tr: NOT DETECTED
N gonorrhoeae: NOT DETECTED

## 2023-03-08 LAB — HIV ANTIBODY (ROUTINE TESTING W REFLEX): HIV Screen 4th Generation wRfx: NONREACTIVE

## 2023-03-08 LAB — POC URINE PREG, ED: Preg Test, Ur: NEGATIVE

## 2023-03-08 MED ORDER — METRONIDAZOLE 0.75 % VA GEL: 1.0000 | Freq: Two times a day (BID) | VAGINAL | 0 refills | Status: AC

## 2023-03-08 MED ORDER — DOXYCYCLINE MONOHYDRATE 100 MG PO TABS: 100.0000 mg | ORAL_TABLET | Freq: Two times a day (BID) | ORAL | 0 refills | Status: AC

## 2023-03-08 MED ORDER — CEFTRIAXONE SODIUM 1 G IJ SOLR
500.0000 mg | Freq: Once | INTRAMUSCULAR | Status: AC
Start: 2023-03-08 — End: 2023-03-08
  Administered 2023-03-08: 500 mg via INTRAMUSCULAR
  Filled 2023-03-08: qty 10

## 2023-03-08 NOTE — ED Triage Notes (Signed)
Pt to ED via POV from home. Pt reports possible pregnancy. Pt states last period more than a month ago. Pt reports N/V, mood swings and increased hunger. Pt states pregnancy tests at home have been negative.

## 2023-03-08 NOTE — ED Provider Notes (Signed)
[facility] Provider Note    Event Date/Time   First MD Initiated Contact with Patient 03/08/23 1223     (approximate)   History   Possible Pregnancy   HPI  Sarah Oneill is a 22 y.o. female with a past medical history of obesity, anxiety who presents today with request for pregnancy test.  Patient reports that her period is late therefore she thinks that she is pregnant.  She denies abdominal pain, nausea, vomiting.  She denies any other vaginal discharge.  She denies burning with urination.  She declines any other testing besides a pregnancy test, though agreeable to urinalysis.  Patient Active Problem List   Diagnosis Date Noted   Gonorrhea 01/19/2023   Acute cystitis without hematuria 01/07/2023   History of cesarean section 07/17/2022   Morbid obesity (HCC) 07/17/2022   HSV (herpes simplex virus) anogenital infection 10/10/2019   ADHD (attention deficit hyperactivity disorder) 08/02/2019   Bee sting allergy 08/02/2019   Shellfish allergy 08/02/2019   Recurrent UTI 01/29/2018   Chronic constipation 10/20/2017   Anxiety state 07/18/2015   Astigmatism 08/20/2011   Insulin resistance 08/20/2011          Physical Exam   Triage Vital Signs: ED Triage Vitals  Encounter Vitals Group     BP 03/08/23 1153 (!) 142/99     Systolic BP Percentile --      Diastolic BP Percentile --      Pulse Rate 03/08/23 1151 82     Resp 03/08/23 1151 20     Temp 03/08/23 1151 98 F (36.7 C)     Temp Source 03/08/23 1151 Oral     SpO2 03/08/23 1151 98 %     Weight --      Height --      Head Circumference --      Peak Flow --      Pain Score 03/08/23 1152 0     Pain Loc --      Pain Education --      Exclude from Growth Chart --     Most recent vital signs: Vitals:   03/08/23 1151 03/08/23 1153  BP:  (!) 142/99  Pulse: 82   Resp: 20   Temp: 98 F (36.7 C)   SpO2: 98%     Physical Exam Vitals and nursing note reviewed.  Constitutional:       General: Awake and alert. No acute distress.    Appearance: Normal appearance. The patient is obese.  HENT:     Head: Normocephalic and atraumatic.     Mouth: Mucous membranes are moist.  Eyes:     General: PERRL. Normal EOMs        Right eye: No discharge.        Left eye: No discharge.     Conjunctiva/sclera: Conjunctivae normal.  Cardiovascular:     Rate and Rhythm: Normal rate and regular rhythm.     Pulses: Normal pulses.  Pulmonary:     Effort: Pulmonary effort is normal. No respiratory distress.     Breath sounds: Normal breath sounds.  Abdominal:     Abdomen is soft. There is no abdominal tenderness. No rebound or guarding. No distention. Musculoskeletal:        General: No swelling. Normal range of motion.     Cervical back: Normal range of motion and neck supple.  Skin:    General: Skin is warm and dry.     Capillary Refill: Capillary refill  takes less than 2 seconds.     Findings: No rash.  Neurological:     Mental Status: The patient is awake and alert.      ED Results / Procedures / Treatments   Labs (all labs ordered are listed, but only abnormal results are displayed) Labs Reviewed  WET PREP, GENITAL - Abnormal; Notable for the following components:      Result Value   Clue Cells Wet Prep HPF POC PRESENT (*)    All other components within normal limits  URINALYSIS, W/ REFLEX TO CULTURE (INFECTION SUSPECTED) - Abnormal; Notable for the following components:   Color, Urine YELLOW (*)    APPearance CLOUDY (*)    Hgb urine dipstick SMALL (*)    Leukocytes,Ua LARGE (*)    Bacteria, UA FEW (*)    All other components within normal limits  CHLAMYDIA/NGC RT PCR ([facility] ONLY)            RPR  HIV ANTIBODY (ROUTINE TESTING W REFLEX)  POC URINE PREG, ED     EKG     RADIOLOGY     PROCEDURES:  Critical Care performed:   Procedures   MEDICATIONS ORDERED IN ED: Medications  cefTRIAXone (ROCEPHIN) injection 500 mg (500 mg Intramuscular Given  03/08/23 1411)     IMPRESSION / MDM / ASSESSMENT AND PLAN / ED COURSE  I reviewed the triage vital signs and the nursing notes.   Differential diagnosis includes, but is not limited to, pregnancy, UTI, hormone fluctuations, stress.  Patient is awake and alert, hemodynamically stable and afebrile.  She is nontoxic in appearance.  She denies abdominal pain and has no abdominal tenderness on exam.  Her urine pregnancy was negative, patient is reassured and she does not wish to have any other test done today.  She denies any concern for STD, denies vaginal discharge, does not wish to be tested for STD today.  I reviewed the patient's chart.  Patient was positive for gonorrhea, chlamydia, and yeast on 01/05/2023.  She reports that she completed treatment.  Her urinalysis today reveals large leukocytes and few bacteria, no nitrites.  She denies having any burning with urination to suggest UTI.  I again discussed with her the possibility of STDs with her boyfriend not present in the room, and she agrees to STD testing.  She wishes to be treated empirically and not wait for results, understands she can find the results on MyChart.  Wet prep is positive for bacterial vaginosis.  Patient was given the option of intravaginal versus p.o. Flagyl and prefers the intravaginal.  She was advised to not have intercourse until the results of her STD testing.  She still does not wish to wait for these results.  However, she was treated with Rocephin and doxycycline was sent to her pharmacy.  Patient was advised that there are many other STDs that patient could have, that we do not routinely test for in the emergency department. Patient was advised to follow up with a primary care doctor or the health department to have the full panel of testing performed. Patient was advised to not have sexual intercourse until fully and properly tested and treated. Patient was advised that his partner also needs to be tested and treated.  Patient understands and agrees with plan.   Patient's presentation is most consistent with acute complicated illness / injury requiring diagnostic workup.      FINAL CLINICAL IMPRESSION(S) / ED DIAGNOSES   Final diagnoses:  Bacterial vaginosis  Rx / DC Orders   ED Discharge Orders          Ordered    metroNIDAZOLE (METROGEL) 0.75 % vaginal gel  2 times daily        03/08/23 1442    doxycycline (ADOXA) 100 MG tablet  2 times daily        03/08/23 1442             Note:  This document was prepared using Dragon voice recognition software and may include unintentional dictation errors.   Jackelyn Hoehn, PA-C 03/08/23 1445    Jene Every, MD 03/08/23 1447

## 2023-03-08 NOTE — Discharge Instructions (Signed)
You did not wish to wait for the results of your gonorrhea/chlamydia test.  You can find these on MyChart.  Your bacterial vaginosis test is positive, please take the MetroGel as prescribed. Remember that there are many other STDs that and you should follow up with a primary care doctor to have the full panel of testing performed. Please do not have sexual intercourse until fully and properly tested and treated. Your partner also needs to be tested and treated.  Please return for any new, worsening, or change in symptoms or other concerns.  It was a pleasure caring for you today.

## 2023-03-08 NOTE — ED Notes (Signed)
See triage notes. Patient is concerned she is pregnant. Patient's last menses was Oct 1.

## 2023-03-09 LAB — RPR: RPR Ser Ql: NONREACTIVE

## 2023-03-26 ENCOUNTER — Telehealth: Payer: BC Managed Care – PPO

## 2023-03-30 NOTE — Progress Notes (Unsigned)
Patient, No Pcp Per   No chief complaint on file.   HPI:      [name] is a 22 y.o. G1P1001 whose LMP was No LMP recorded., presents today for ***  Nexplann removed 3/24 Neg pap 3/24; gon/chlam pos 9/24, neg TOC 11/24  Patient Active Problem List   Diagnosis Date Noted   Gonorrhea 01/19/2023   Acute cystitis without hematuria 01/07/2023   History of cesarean section 07/17/2022   Morbid obesity (HCC) 07/17/2022   HSV (herpes simplex virus) anogenital infection 10/10/2019   ADHD (attention deficit hyperactivity disorder) 08/02/2019   Bee sting allergy 08/02/2019   Shellfish allergy 08/02/2019   Recurrent UTI 01/29/2018   Chronic constipation 10/20/2017   Anxiety state 07/18/2015   Astigmatism 08/20/2011   Insulin resistance 08/20/2011    Past Surgical History:  Procedure Laterality Date   CESAREAN SECTION  03/13/2020   Procedure: CESAREAN SECTION;  Surgeon: Nadara Mustard, MD;  Location: [facility];  Service: Obstetrics;;   HERNIA REPAIR     [facility]   TONSILLECTOMY AND ADENOIDECTOMY N/A 08/30/2014   Procedure: TONSILLECTOMY AND ADENOIDECTOMY;  Surgeon: Bud Face, MD;  Location: [facility];  Service: ENT;  Laterality: N/A;  adenoids cauterized no tissue sent to lab   WISDOM TOOTH EXTRACTION      Family History  Problem Relation Age of Onset   Seizures Mother    Bipolar disorder Mother    ADD / ADHD Mother    Anxiety disorder Mother    Migraines Mother    Migraines Father    Diabetes Father    Hypertension Father    Bipolar disorder Sister    ADD / ADHD Sister    Migraines Sister    Depression Maternal Grandmother    Migraines Maternal Grandmother    Hypertension Maternal Grandmother    Asthma Maternal Grandmother    Anxiety disorder Maternal Grandmother    Hearing loss Maternal Grandmother    Suicidality Maternal Grandfather    Hypertension Paternal Grandmother    Bipolar disorder Paternal Grandmother    Migraines Paternal  Grandmother     Social History   Socioeconomic History   Marital status: Significant Other    Spouse name: Sarah Oneill   Number of children: Not on file   Years of education: Not on file   Highest education level: Not on file  Occupational History   Not on file  Tobacco Use   Smoking status: Never    Passive exposure: Yes   Smokeless tobacco: Never  Vaping Use   Vaping status: Never Used  Substance and Sexual Activity   Alcohol use: No   Drug use: No   Sexual activity: Yes    Partners: Male    Birth control/protection: Implant  Other Topics Concern   Not on file  Social History Narrative   Sarah Oneill attends 12th grade at [employer]. She is doing well.  She was retained once in 1 st grade.   Lives with her father. She enjoys playing volley ball, cheer and sleep   Social Determinants of Health   Financial Resource Strain: Not on file  Food Insecurity: Not on file  Transportation Needs: Not on file  Physical Activity: Not on file  Stress: Not on file  Social Connections: Not on file  Intimate Partner Violence: Not At Risk (07/17/2022)   Humiliation, Afraid, Rape, and Kick questionnaire    Fear of Current or Ex-Partner: No    Emotionally Abused: No  Physically Abused: No    Sexually Abused: No    Outpatient Medications Prior to Visit  Medication Sig Dispense Refill   albuterol (VENTOLIN HFA) 108 (90 Base) MCG/ACT inhaler Inhale 2 puffs into the lungs every 6 (six) hours as needed for wheezing or shortness of breath. 8 g 2   EPINEPHrine 0.3 mg/0.3 mL IJ SOAJ injection Inject 0.3 mg into the muscle as needed. For Bee stings     etonogestrel (NEXPLANON) 68 MG IMPL implant 1 each by Subdermal route once.     Facility-Administered Medications Prior to Visit  Medication Dose Route Frequency Provider Last Rate Last Admin   azithromycin (ZITHROMAX) powder 1 g  1 g Oral Once           ROS:  Review of Systems BREAST: No symptoms   OBJECTIVE:   Vitals:  There were no  vitals taken for this visit.  Physical Exam  Results: No results found for this or any previous visit (from the past 24 hour(s)).   Assessment/Plan: No diagnosis found.    No orders of the defined types were placed in this encounter.     No follow-ups on file.  Byrdie Miyazaki B. Korry Dalgleish, PA-C 03/30/2023 6:34 PM

## 2023-03-31 ENCOUNTER — Encounter: Payer: Self-pay | Admitting: Obstetrics and Gynecology

## 2023-03-31 ENCOUNTER — Ambulatory Visit (INDEPENDENT_AMBULATORY_CARE_PROVIDER_SITE_OTHER): Payer: BC Managed Care – PPO | Admitting: Obstetrics and Gynecology

## 2023-03-31 VITALS — BP 123/85 | HR 76 | Ht 63.0 in | Wt 264.0 lb

## 2023-03-31 DIAGNOSIS — N914 Secondary oligomenorrhea: Secondary | ICD-10-CM

## 2023-03-31 DIAGNOSIS — Z3202 Encounter for pregnancy test, result negative: Secondary | ICD-10-CM | POA: Diagnosis not present

## 2023-03-31 LAB — POCT URINE PREGNANCY: Preg Test, Ur: NEGATIVE

## 2023-03-31 MED ORDER — MEDROXYPROGESTERONE ACETATE 10 MG PO TABS: 10.0000 mg | ORAL_TABLET | Freq: Every day | ORAL | 0 refills | Status: DC

## 2023-03-31 NOTE — Patient Instructions (Signed)
I value your feedback and you entrusting us with your care. If you get a [facility] patient survey, I would appreciate you taking the time to let us know about your experience today. Thank you! ? ? ?

## 2023-04-06 ENCOUNTER — Encounter: Payer: Self-pay | Admitting: Obstetrics and Gynecology

## 2023-05-14 ENCOUNTER — Other Ambulatory Visit: Payer: Self-pay

## 2023-05-14 ENCOUNTER — Emergency Department
Admission: EM | Admit: 2023-05-14 | Discharge: 2023-05-14 | Disposition: A | Discharge status: Home / Self Care | Payer: Self-pay | Attending: Emergency Medicine | Admitting: Emergency Medicine

## 2023-05-14 DIAGNOSIS — Z20822 Contact with and (suspected) exposure to covid-19: Secondary | ICD-10-CM | POA: Insufficient documentation

## 2023-05-14 DIAGNOSIS — R509 Fever, unspecified: Secondary | ICD-10-CM | POA: Insufficient documentation

## 2023-05-14 DIAGNOSIS — R059 Cough, unspecified: Secondary | ICD-10-CM | POA: Insufficient documentation

## 2023-05-14 DIAGNOSIS — J111 Influenza due to unidentified influenza virus with other respiratory manifestations: Secondary | ICD-10-CM

## 2023-05-14 LAB — RESP PANEL BY RT-PCR (RSV, FLU A&B, COVID)  RVPGX2
Influenza A by PCR: NEGATIVE
Influenza B by PCR: NEGATIVE
Resp Syncytial Virus by PCR: NEGATIVE
SARS Coronavirus 2 by RT PCR: NEGATIVE

## 2023-05-14 MED ORDER — ACETAMINOPHEN 325 MG PO TABS
650.0000 mg | ORAL_TABLET | Freq: Once | ORAL | Status: AC
  Administered 2023-05-14: 650 mg via ORAL
  Filled 2023-05-14: qty 2

## 2023-05-14 NOTE — ED Provider Notes (Signed)
[facility] Provider Note    Event Date/Time   First MD Initiated Contact with Patient 05/14/23 [phone]     (approximate)   History   Generalized Body Aches   HPI  Sarah Oneill is a 23 y.o. female with history of high fever, that started last night.  Her friend that is with her was sick cough cold and fever.  Did not get tested.  Denies vomiting diarrhea.  No chest pain shortness of breath or dysuria      Physical Exam   Triage Vital Signs: ED Triage Vitals  Encounter Vitals Group     BP 05/14/23 0800 (!) 158/99     Systolic BP Percentile --      Diastolic BP Percentile --      Pulse Rate 05/14/23 0800 (!) 119     Resp 05/14/23 0800 17     Temp 05/14/23 0759 100.3 F (37.9 C)     Temp src --      SpO2 05/14/23 0800 99 %     Weight 05/14/23 0758 260 lb (117.9 kg)     Height 05/14/23 0758 5\' 3"  (1.6 m)     Head Circumference --      Peak Flow --      Pain Score 05/14/23 0758 10     Pain Loc --      Pain Education --      Exclude from Growth Chart --     Most recent vital signs: Vitals:   05/14/23 0759 05/14/23 0800  BP:  (!) 158/99  Pulse:  (!) 119  Resp:  17  Temp: 100.3 F (37.9 C)   SpO2:  99%     General: Awake, no distress.   CV:  Good peripheral perfusion. regular rate and  rhythm Resp:  Normal effort. Lungs CTA Abd:  No distention.   Other:      ED Results / Procedures / Treatments   Labs (all labs ordered are listed, but only abnormal results are displayed) Labs Reviewed  RESP PANEL BY RT-PCR (RSV, FLU A&B, COVID)  [id number]     EKG     RADIOLOGY     PROCEDURES:   Procedures   MEDICATIONS ORDERED IN ED: Medications  acetaminophen (TYLENOL) tablet 650 mg (650 mg Oral Given 05/14/23 0802)     IMPRESSION / MDM / ASSESSMENT AND PLAN / ED COURSE  I reviewed the triage vital signs and the nursing notes.                              Differential diagnosis includes, but is not limited to, COVID,  influenza, RSV, viral URI  Patient's presentation is most consistent with acute illness / injury with system symptoms.   Due to the prevalence of influenza area.  She does not want to stay and wait for her test result.  She is given a work note discussed over-the-counter comfort.  Return emergency department if worsening.  Patient is in agreement with treatment plan.  Discharged stable condition.  Flu swab is reassuring.  Patient may return to work on Monday if possible.  I did tell her she is still contagious as long she has fever   FINAL CLINICAL IMPRESSION(S) / ED DIAGNOSES   Final diagnoses:  Influenza-like illness     Rx / DC Orders   ED Discharge Orders     None  Note:  This document was prepared using Dragon voice recognition software and may include unintentional dictation errors.    Faythe Ghee, PA-C 05/14/23 1006    Concha Se, MD 05/15/23 [phone]

## 2023-05-14 NOTE — ED Triage Notes (Addendum)
Pt to ED for body aches started yesterday. +fever, has not checked it. Scrolling on phone in triage, NAD noted

## 2023-05-14 NOTE — ED Notes (Signed)
See triage notes. Patient c/o body aches and fever that started yesterday.

## 2023-08-06 NOTE — Progress Notes (Signed)
 PCP:  Patient, No Pcp Per   Chief Complaint  Patient presents with   Gynecologic Exam    Lump on RB x 1 week, sharp pains.     HPI:      Sarah Oneill is a 23 y.o. G1P1001 whose LMP was Patient's last menstrual period was 07/21/2023 (exact date)., presents today for her annual examination.  Her menses are monthly (first of the month), lasting 7 days, mod flow, no BTB, mild dysmen, improved with tylenol /NSAIDs. Seen for oligomenorrhea 12/24 and given provera  with withdrawal bleed; menses returned to normal since.   Sex activity: single partner, contraception - Nexplanon  placed 04/19/20, removed 3/24 for conception. Taking PNVs. No conception in a year. Conceived easily with different partner 3 yrs ago. Current partner has 81 yo child. Pt hasn't had serum prog done; hx of gon/chlam 9/24. S/p C/S 3 yrs ago. Pt is G1P1. Last Pap: 07/17/22 Results were NILM Hx of STDs: gon/chlam 9/24 with neg TOC 11/24  There is no FH of breast cancer. There is no FH of ovarian cancer. The patient does self-breast exams and noticed RT breast mass last wk. No change in size since first noted. Has occas sharp pains in breast now. No erythema/trauma/nipple d/c.   Tobacco use: The patient denies current or previous tobacco use. Alcohol use: none No drug use.  Exercise: moderately active  She does get adequate calcium but not Vitamin D in her diet.  Patient Active Problem List   Diagnosis Date Noted   Gonorrhea 01/19/2023   Acute cystitis without hematuria 01/07/2023   History of cesarean section 07/17/2022   Morbid obesity (HCC) 07/17/2022   HSV (herpes simplex virus) anogenital infection 10/10/2019   ADHD (attention deficit hyperactivity disorder) 08/02/2019   Bee sting allergy 08/02/2019   Shellfish allergy 08/02/2019   Recurrent UTI 01/29/2018   Chronic constipation 10/20/2017   Anxiety state 07/18/2015   Astigmatism 08/20/2011   Insulin resistance 08/20/2011    Past Surgical History:   Procedure Laterality Date   CESAREAN SECTION  03/13/2020   Procedure: CESAREAN SECTION;  Surgeon: Alben Alma, MD;  Location: [facility] ORS;  Service: Obstetrics;;   HERNIA REPAIR     [facility]   TONSILLECTOMY AND ADENOIDECTOMY N/A 08/30/2014   Procedure: TONSILLECTOMY AND ADENOIDECTOMY;  Surgeon: Rogers Clayman, MD;  Location: [facility];  Service: ENT;  Laterality: N/A;  adenoids cauterized no tissue sent to lab   WISDOM TOOTH EXTRACTION      Family History  Problem Relation Age of Onset   Seizures Mother    Bipolar disorder Mother    ADD / ADHD Mother    Anxiety disorder Mother    Migraines Mother    Migraines Father    Diabetes Father    Hypertension Father    Bipolar disorder Sister    ADD / ADHD Sister    Migraines Sister    Depression Maternal Grandmother    Migraines Maternal Grandmother    Hypertension Maternal Grandmother    Asthma Maternal Grandmother    Anxiety disorder Maternal Grandmother    Hearing loss Maternal Grandmother    Suicidality Maternal Grandfather    Hypertension Paternal Grandmother    Bipolar disorder Paternal Grandmother    Migraines Paternal Grandmother     Social History   Socioeconomic History   Marital status: Significant Other    Spouse name: Aida House   Number of children: Not on file   Years of education: Not on file   Highest  education level: Not on file  Occupational History   Not on file  Tobacco Use   Smoking status: Never    Passive exposure: Yes   Smokeless tobacco: Never  Vaping Use   Vaping status: Never Used  Substance and Sexual Activity   Alcohol use: No   Drug use: No   Sexual activity: Yes    Partners: Male    Birth control/protection: None  Other Topics Concern   Not on file  Social History Narrative   Sarah Oneill attends 12th grade at [employer]. She is doing well.  She was retained once in 1 st grade.   Lives with her father. She enjoys playing volley ball, cheer and sleep   Social Drivers of Manufacturing engineer Strain: Not on file  Food Insecurity: Not on file  Transportation Needs: Not on file  Physical Activity: Not on file  Stress: Not on file  Social Connections: Not on file  Intimate Partner Violence: Not At Risk (07/17/2022)   Humiliation, Afraid, Rape, and Kick questionnaire    Fear of Current or Ex-Partner: No    Emotionally Abused: No    Physically Abused: No    Sexually Abused: No     Current Outpatient Medications:    albuterol  (VENTOLIN  HFA) 108 (90 Base) MCG/ACT inhaler, Inhale 2 puffs into the lungs every 6 (six) hours as needed for wheezing or shortness of breath., Disp: 8 g, Rfl: 2   EPINEPHrine  0.3 mg/0.3 mL IJ SOAJ injection, Inject 0.3 mg into the muscle as needed. For Bee stings, Disp: , Rfl:   Current Facility-Administered Medications:    azithromycin  (ZITHROMAX ) powder 1 g, 1 g, Oral, Once,      ROS:  Review of Systems  Constitutional:  Positive for fatigue. Negative for fever and unexpected weight change.  Respiratory:  Negative for cough, shortness of breath and wheezing.   Cardiovascular:  Negative for chest pain, palpitations and leg swelling.  Gastrointestinal:  Negative for blood in stool, constipation, diarrhea, nausea and vomiting.  Endocrine: Negative for cold intolerance, heat intolerance and polyuria.  Genitourinary:  Negative for dyspareunia, dysuria, flank pain, frequency, genital sores, hematuria, menstrual problem, pelvic pain, urgency, vaginal bleeding, vaginal discharge and vaginal pain.  Musculoskeletal:  Negative for back pain, joint swelling and myalgias.  Skin:  Negative for rash.  Neurological:  Positive for headaches. Negative for dizziness, syncope, light-headedness and numbness.  Hematological:  Negative for adenopathy.  Psychiatric/Behavioral:  Positive for agitation. Negative for confusion, sleep disturbance and suicidal ideas. The patient is not nervous/anxious.    BREAST: mass   Objective: BP 136/81   Pulse  82   Ht 5\' 3"  (1.6 m)   Wt 275 lb (124.7 kg)   LMP 07/21/2023 (Exact Date)   BMI 48.71 kg/m    Physical Exam Constitutional:      Appearance: She is well-developed.  Genitourinary:     Vulva normal.     Genitourinary Comments: LARGE PENDULOUS BREASTS     Right Labia: No rash, tenderness or lesions.    Left Labia: No tenderness, lesions or rash.    No vaginal discharge, erythema or tenderness.      Right Adnexa: not tender and no mass present.    Left Adnexa: not tender and no mass present.    No cervical friability or polyp.     Uterus is not enlarged or tender.  Breasts:    Right: Mass present. No nipple discharge, skin change or tenderness.  Left: No mass, nipple discharge, skin change or tenderness.  Neck:     Thyroid: No thyromegaly.  Cardiovascular:     Rate and Rhythm: Normal rate and regular rhythm.     Heart sounds: Normal heart sounds. No murmur heard. Pulmonary:     Effort: Pulmonary effort is normal.     Breath sounds: Normal breath sounds.  Chest:    Abdominal:     Palpations: Abdomen is soft.     Tenderness: There is no abdominal tenderness. There is no guarding or rebound.  Musculoskeletal:        General: Normal range of motion.     Cervical back: Normal range of motion.  Lymphadenopathy:     Cervical: No cervical adenopathy.  Neurological:     General: No focal deficit present.     Mental Status: She is alert and oriented to person, place, and time.     Cranial Nerves: No cranial nerve deficit.  Skin:    General: Skin is warm and dry.  Psychiatric:        Mood and Affect: Mood normal.        Behavior: Behavior normal.        Thought Content: Thought content normal.        Judgment: Judgment normal.  Vitals reviewed.    Assessment/Plan: Encounter for annual routine gynecological examination  Screening for STD (sexually transmitted disease) - Plan: Cervicovaginal ancillary only  Mass of upper outer quadrant of right breast - Plan: US   LIMITED ULTRASOUND INCLUDING AXILLA RIGHT BREAST; 9:00 POS RT breast, pt to schedule u/s. Will f/u with results.   Breast pain, right  Infertility counseling - Plan: Progesterone; check serum prog today, will then coordinate with next menses. If WNL, will check GYN u/s.          GYN counsel adequate intake of calcium and vitamin D, diet and exercise     F/U  Return in about 1 year (around 08/12/2024).  Azzan Butler B. Lamaya Hyneman, PA-C 08/13/2023 10:32 AM

## 2023-08-13 ENCOUNTER — Encounter: Payer: Self-pay | Admitting: Obstetrics and Gynecology

## 2023-08-13 ENCOUNTER — Other Ambulatory Visit (HOSPITAL_COMMUNITY)
Admission: RE | Admit: 2023-08-13 | Discharge: 2023-08-13 | Disposition: A | Discharge status: Home / Self Care | Source: Ambulatory Visit | Attending: Obstetrics and Gynecology | Admitting: Obstetrics and Gynecology

## 2023-08-13 ENCOUNTER — Ambulatory Visit (INDEPENDENT_AMBULATORY_CARE_PROVIDER_SITE_OTHER): Admitting: Obstetrics and Gynecology

## 2023-08-13 VITALS — BP 136/81 | HR 82 | Ht 63.0 in | Wt 275.0 lb

## 2023-08-13 DIAGNOSIS — N914 Secondary oligomenorrhea: Secondary | ICD-10-CM

## 2023-08-13 DIAGNOSIS — N6311 Unspecified lump in the right breast, upper outer quadrant: Secondary | ICD-10-CM

## 2023-08-13 DIAGNOSIS — Z01419 Encounter for gynecological examination (general) (routine) without abnormal findings: Secondary | ICD-10-CM | POA: Diagnosis not present

## 2023-08-13 DIAGNOSIS — N644 Mastodynia: Secondary | ICD-10-CM

## 2023-08-13 DIAGNOSIS — Z113 Encounter for screening for infections with a predominantly sexual mode of transmission: Secondary | ICD-10-CM

## 2023-08-13 DIAGNOSIS — Z3169 Encounter for other general counseling and advice on procreation: Secondary | ICD-10-CM

## 2023-08-13 NOTE — Patient Instructions (Signed)
 I value your feedback and you entrusting [place] with your care. If you get a [place] patient survey, I would appreciate you taking the time to let us know about your experience today. Thank you! ? ? ?

## 2023-08-14 ENCOUNTER — Ambulatory Visit
Admission: RE | Admit: 2023-08-14 | Discharge: 2023-08-14 | Disposition: A | Discharge status: Home / Self Care | Source: Ambulatory Visit | Attending: Obstetrics and Gynecology | Admitting: Obstetrics and Gynecology

## 2023-08-14 ENCOUNTER — Encounter: Payer: Self-pay | Admitting: Obstetrics and Gynecology

## 2023-08-14 DIAGNOSIS — N6311 Unspecified lump in the right breast, upper outer quadrant: Secondary | ICD-10-CM | POA: Insufficient documentation

## 2023-08-14 DIAGNOSIS — Z3169 Encounter for other general counseling and advice on procreation: Secondary | ICD-10-CM

## 2023-08-14 LAB — PROGESTERONE: Progesterone: 6.1 ng/mL

## 2023-08-17 ENCOUNTER — Other Ambulatory Visit: Payer: Self-pay | Admitting: Obstetrics and Gynecology

## 2023-08-17 DIAGNOSIS — R928 Other abnormal and inconclusive findings on diagnostic imaging of breast: Secondary | ICD-10-CM

## 2023-08-17 LAB — CERVICOVAGINAL ANCILLARY ONLY
Chlamydia: NEGATIVE
Comment: NEGATIVE
Comment: NORMAL
Neisseria Gonorrhea: NEGATIVE

## 2023-08-17 NOTE — Telephone Encounter (Signed)
 Spoke with pt.  Questions answered.

## 2023-08-18 ENCOUNTER — Other Ambulatory Visit

## 2023-08-18 ENCOUNTER — Ambulatory Visit
Admission: RE | Admit: 2023-08-18 | Discharge: 2023-08-18 | Disposition: A | Discharge status: Home / Self Care | Source: Ambulatory Visit | Attending: Obstetrics and Gynecology | Admitting: Obstetrics and Gynecology

## 2023-08-18 DIAGNOSIS — N611 Abscess of the breast and nipple: Secondary | ICD-10-CM | POA: Insufficient documentation

## 2023-08-18 DIAGNOSIS — R928 Other abnormal and inconclusive findings on diagnostic imaging of breast: Secondary | ICD-10-CM | POA: Diagnosis present

## 2023-08-18 HISTORY — PX: BREAST BIOPSY: SHX20

## 2023-08-18 MED ORDER — LIDOCAINE-EPINEPHRINE 1 %-1:100000 IJ SOLN
10.0000 mL | Freq: Once | INTRAMUSCULAR | Status: AC
  Administered 2023-08-18: 10 mL
  Filled 2023-08-18: qty 10

## 2023-08-18 MED ORDER — LIDOCAINE 1 % OPTIME INJ - NO CHARGE
5.0000 mL | Freq: Once | INTRAMUSCULAR | Status: AC
  Administered 2023-08-18: 5 mL
  Filled 2023-08-18: qty 6

## 2023-08-20 LAB — SURGICAL PATHOLOGY

## 2023-08-25 ENCOUNTER — Encounter: Payer: Self-pay | Admitting: Obstetrics and Gynecology

## 2023-09-11 ENCOUNTER — Ambulatory Visit
Admission: RE | Admit: 2023-09-11 | Discharge: 2023-09-11 | Disposition: A | Discharge status: Home / Self Care | Source: Ambulatory Visit | Attending: Obstetrics and Gynecology | Admitting: Obstetrics and Gynecology

## 2023-09-11 DIAGNOSIS — Z3169 Encounter for other general counseling and advice on procreation: Secondary | ICD-10-CM | POA: Diagnosis present

## 2023-09-24 ENCOUNTER — Ambulatory Visit: Payer: Self-pay | Admitting: Obstetrics and Gynecology

## 2024-01-24 ENCOUNTER — Other Ambulatory Visit: Payer: Self-pay | Admitting: Obstetrics and Gynecology

## 2024-01-24 DIAGNOSIS — N6311 Unspecified lump in the right breast, upper outer quadrant: Secondary | ICD-10-CM

## 2024-01-24 NOTE — Progress Notes (Signed)
 F/u breast u/s order; pt to scheduling

## 2024-01-25 ENCOUNTER — Ambulatory Visit: Admitting: Obstetrics and Gynecology

## 2024-02-18 ENCOUNTER — Other Ambulatory Visit: Payer: Self-pay | Admitting: Obstetrics and Gynecology

## 2024-02-18 ENCOUNTER — Ambulatory Visit
Admission: RE | Admit: 2024-02-18 | Discharge: 2024-02-18 | Disposition: A | Discharge status: Home / Self Care | Source: Ambulatory Visit | Attending: Obstetrics and Gynecology | Admitting: Obstetrics and Gynecology

## 2024-02-18 DIAGNOSIS — N6311 Unspecified lump in the right breast, upper outer quadrant: Secondary | ICD-10-CM | POA: Insufficient documentation

## 2024-02-18 DIAGNOSIS — R928 Other abnormal and inconclusive findings on diagnostic imaging of breast: Secondary | ICD-10-CM

## 2024-02-21 ENCOUNTER — Ambulatory Visit: Payer: Self-pay | Admitting: Obstetrics and Gynecology

## 2024-02-24 ENCOUNTER — Ambulatory Visit
Admission: RE | Admit: 2024-02-24 | Discharge: 2024-02-24 | Disposition: A | Discharge status: Home / Self Care | Source: Ambulatory Visit | Attending: Obstetrics and Gynecology | Admitting: Obstetrics and Gynecology

## 2024-02-24 DIAGNOSIS — R928 Other abnormal and inconclusive findings on diagnostic imaging of breast: Secondary | ICD-10-CM

## 2024-02-24 DIAGNOSIS — N6121 Granulomatous mastitis, right breast: Secondary | ICD-10-CM | POA: Diagnosis present

## 2024-02-24 HISTORY — PX: BREAST BIOPSY: SHX20

## 2024-02-24 MED ORDER — LIDOCAINE 1 % OPTIME INJ - NO CHARGE
2.0000 mL | Freq: Once | INTRAMUSCULAR | Status: AC
  Administered 2024-02-24: 2 mL
  Filled 2024-02-24: qty 2

## 2024-02-24 MED ORDER — LIDOCAINE-EPINEPHRINE 1 %-1:100000 IJ SOLN
5.0000 mL | Freq: Once | INTRAMUSCULAR | Status: AC
  Administered 2024-02-24: 5 mL
  Filled 2024-02-24: qty 5

## 2024-02-25 ENCOUNTER — Encounter: Payer: Self-pay | Admitting: *Deleted

## 2024-02-25 DIAGNOSIS — N61 Mastitis without abscess: Secondary | ICD-10-CM

## 2024-02-25 LAB — SURGICAL PATHOLOGY

## 2024-02-25 NOTE — Progress Notes (Signed)
 Referral received. 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 Referral placed.

## 2024-02-26 ENCOUNTER — Ambulatory Visit: Payer: Self-pay | Admitting: Obstetrics and Gynecology

## 2024-02-29 LAB — AEROBIC/ANAEROBIC CULTURE W GRAM STAIN (SURGICAL/DEEP WOUND)
Culture: NO GROWTH
Gram Stain: NONE SEEN
Special Requests: NORMAL

## 2024-03-10 ENCOUNTER — Emergency Department
Admission: EM | Admit: 2024-03-10 | Discharge: 2024-03-10 | Disposition: A | Discharge status: Home / Self Care | Attending: Emergency Medicine | Admitting: Emergency Medicine

## 2024-03-10 ENCOUNTER — Other Ambulatory Visit: Payer: Self-pay

## 2024-03-10 DIAGNOSIS — J020 Streptococcal pharyngitis: Secondary | ICD-10-CM | POA: Insufficient documentation

## 2024-03-10 DIAGNOSIS — I10 Essential (primary) hypertension: Secondary | ICD-10-CM | POA: Diagnosis not present

## 2024-03-10 DIAGNOSIS — J029 Acute pharyngitis, unspecified: Secondary | ICD-10-CM | POA: Diagnosis present

## 2024-03-10 LAB — GROUP A STREP BY PCR: Group A Strep by PCR: DETECTED — AB

## 2024-03-10 MED ORDER — AZITHROMYCIN 250 MG PO TABS: ORAL_TABLET | ORAL | 0 refills | Status: DC

## 2024-03-10 NOTE — Discharge Instructions (Addendum)
 Follow-up with your care provider or urgent care if any continued problems or concerns.  Increase fluids to stay hydrated.  Tylenol  or ibuprofen  as needed for throat pain, body aches or fever.  A prescription for Zithromax  was sent to the pharmacy for you to take until completely finished.  You are considered contagious until you have been on the antibiotic for 24 hours.  Other family members can also get strep throat.

## 2024-03-10 NOTE — ED Triage Notes (Signed)
 Pt presents to the ED via POV from home with a sore throat since Sunday. Pt reports feeling like something is stuck in her throat. Pt reports having a tonsillectomy in the past. Pt denies fevers, chills, or body aches. Pt reports feeling like her throat is sore when she swallows.

## 2024-03-10 NOTE — ED Provider Notes (Signed)
 [facility] Provider Note    None    (approximate)   History   Sore Throat   HPI  Sarah Oneill is a 23 y.o. female   presents to the ED with complaint of sore throat for approximately 5 days.  Patient denies any fever, chills, nausea or vomiting.  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  Patient has a history of hypertension, ADHD, UTIs, anxiety, chronic constipation and insulin resistance.      Physical Exam   Triage Vital Signs: ED Triage Vitals [03/10/24 0702]  Encounter Vitals Group     BP (!) 148/103     Girls Systolic BP Percentile      Girls Diastolic BP Percentile      Boys Systolic BP Percentile      Boys Diastolic BP Percentile      Pulse Rate 86     Resp 18     Temp 98.4 F (36.9 C)     Temp Source Oral     SpO2 100 %     Weight 267 lb (121.1 kg)     Height 5' 3 (1.6 m)     Head Circumference      Peak Flow      Pain Score 6     Pain Loc      Pain Education      Exclude from Growth Chart     Most recent vital signs: Vitals:   03/10/24 0702 03/10/24 0730  BP: (!) 148/103   Pulse: 86   Resp: 18   Temp: 98.4 F (36.9 C)   SpO2: 100% 100%     General: Awake, no distress.  CV:  Good peripheral perfusion.  Heart regular rate and rhythm. Resp:  Normal effort.  Lungs are clear bilaterally. Abd:  No distention.  Other:  Minimal erythema edema is noted and no exudate.  Neck supple without cervical lymphadenopathy.  Patient is able to swallow saliva without difficulty and normal speech noted.   ED Results / Procedures / Treatments   Labs (all labs ordered are listed, but only abnormal results are displayed) Labs Reviewed  GROUP A STREP BY PCR - Abnormal; Notable for the following components:      Result Value   Group A Strep by PCR DETECTED (*)    All other components within normal limits      PROCEDURES:  Critical Care performed:   Procedures   MEDICATIONS  ORDERED IN ED: Medications - No data to display   IMPRESSION / MDM / ASSESSMENT AND PLAN / ED COURSE  I reviewed the triage vital signs and the nursing notes.   Differential diagnosis includes, but is not limited to, URI, viral pharyngitis, bacterial pharyngitis, reflux, esophagitis.  23 year old female presents to the ED with complaint of sore throat for approximately 4 days.  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  Strep test was positive and patient was made aware.  She is aware that she does need to finish the complete course of antibiotics and that she is contagious for 24 hours.  She is to follow-up with her PCP if any continued problems or urgent concerns.      Patient's presentation is most consistent with acute complicated illness / injury requiring diagnostic workup.  FINAL CLINICAL IMPRESSION(S) / ED DIAGNOSES   Final diagnoses:  Strep pharyngitis  Rx / DC Orders   ED Discharge Orders          Ordered    azithromycin  (ZITHROMAX  Z-PAK) 250 MG tablet        03/10/24 0856             Note:  This document was prepared using Dragon voice recognition software and may include unintentional dictation errors.   Saunders Shona CROME, PA-C 03/10/24 9096    Claudene Rover, MD 03/10/24 1352

## 2024-03-21 ENCOUNTER — Encounter: Payer: Self-pay | Admitting: Surgery

## 2024-03-21 ENCOUNTER — Ambulatory Visit: Admitting: Surgery

## 2024-03-21 VITALS — BP 159/105 | HR 94 | Temp 98.0°F | Ht 63.0 in | Wt 277.4 lb

## 2024-03-21 DIAGNOSIS — N6121 Granulomatous mastitis, right breast: Secondary | ICD-10-CM | POA: Diagnosis not present

## 2024-03-21 NOTE — Patient Instructions (Signed)
 Mastitis  Mastitis is irritation and swelling (inflammation) in an area of the breast. This most often happens in females who are breastfeeding, but it can happen to anyone. This includes males. It will sometimes go away on its own. Other times, mastitis may need treatment. What are the causes? In people who are not breastfeeding, mastitis is often caused by germs (bacteria) that get into breast tissue. This can happen through cuts, cracks, or openings in the skin. Other causes include: Nipple piercing. Some types of breast cancer. What are the signs or symptoms? Swelling, redness, tenderness, and pain in the breast. The area may also feel warm. Swelling of the glands under the arm. Fluid coming from the nipple. Feeling tired (fatigue). Headache and body aches. Fever and chills. Vomiting or feeling like you may vomit (nauseous). Symptoms can last 2-5 days. The pain and redness are the worst on days 2 and 3. This will often go away by day 5. If an infection develops and is not treated, pus or fluid may form under the skin (abscess). How is this diagnosed? Mastitis is often diagnosed based on a physical exam and your symptoms. You may also have other tests, such as: Blood tests. Fluid tests. If fluid collects under the skin, it may be tested to find if germs are present. Breast X-rays or ultrasounds. How is this treated? Treatment may include: Using hot or cold compresses. Pain medicine. Antibiotic or steroid medicine. Rest. Drinking plenty of fluids. Removing pus or fluid from under the skin. Follow these instructions at home: Breast care  Keep your nipples clean and dry. If told, put ice on the affected area. Put ice in a plastic bag. Place a towel between your skin and the bag. Leave the ice on for 20 minutes, 2-3 times a day. If told, put heat on the affected area. Do this as often as told by your doctor. Use the heat source that your doctor recommends, such as a moist heat  pack or heating pad. Place a towel between your skin and the heat source. Leave the heat on for 20-30 minutes. If your skin turns bright red, take off the ice or heat right away to prevent skin damage. The risk of damage is higher if you cannot feel pain, heat, or cold. Medicines Take over-the-counter and prescription medicines only as told by your doctor. If you were prescribed antibiotics, take them as told by your doctor. Do not stop using them even if you start to feel better. Contact a doctor if: You have pus-like fluid leaking from the breast. You have a fever. Your pain and swelling get worse. Your symptoms do not start to get better within 2 days of starting treatment. Your pain is not helped by medicine. Get help right away if: You have a red line going from your breast toward your armpit. This information is not intended to replace advice given to you by your health care provider. Make sure you discuss any questions you have with your health care provider. Document Revised: 02/06/2022 Document Reviewed: 02/06/2022 Elsevier Patient Education  2024 [employer].

## 2024-03-23 NOTE — ED Provider Notes (Signed)
 Initial Provider Assessment  Interpreter used: Level of Interpreter Services: No interpreter needed (no language barrier) Historian: patient  HPI: Pt is a 23 y.o. female with history as listed below who presents to the ED with Dizziness since yesterday morning.   Past Medical History:  Diagnosis Date  . ADHD (attention deficit hyperactivity disorder)   . Bee sting allergy   . Obesity   . Seasonal allergies   . Shellfish allergy     Past Surgical History:  Procedure Laterality Date  . INGUINAL HERNIA REPAIR     at age 73     Vitals:   03/23/24 1132  BP: (!) 125/93  BP Location: Right upper arm  Patient Position: Sitting  Pulse: 102  Resp: 18  Temp: 36.7 C (98.1 F)  TempSrc: Tympanic  SpO2: 100%   Focused Physical Exam: Gen: No Acute Distress HEENT: Normocephalic and atraumatic CV: Regular rate Lung: No respiratory distress Neuro: Alert and awake, moving all extremities well, normal gait  Medical Decision Making and Plan: Given the patient's initial provider assessment, the following diagnostic evaluation and therapeutic interventions have been ordered. The patient will be placed in the appropriate treatment space, once one is available, to complete the evaluation and treatment.  I have discussed the plan of care with the patient.  Tests ordered:  Orders Placed This Encounter  Procedures  . CT brain without contrast  . Basic Metabolic Panel (BMP)  . Complete Blood Count (CBC) with Differential  . Culture, Urine, Routine with Pyuria Screen (reflexed from Urinalysis)  . POC Urine HCG Pregnancy Test  . ECG 12-lead   Treatments ordered:  Medications  lactated ringers  bolus 1,000 mL (has no administration in time range)  meclizine (ANTIVERT) tablet 25 mg (has no administration in time range)      Laurine Blinda Stank, MD 03/23/24 1213

## 2024-03-23 NOTE — Progress Notes (Signed)
 Patient ID: Sarah Oneill, female   DOB: [birth year], 23 y.o.   MRN: [id number]  HPI AZHA CONSTANTIN is a 23 y.o. female seen in consultation at the request of Ms. Copland PA-C. She did have history of right breast pain few months ago.  Prompted mammogram and ultrasound that have personally reviewed she did have evidence on the right side of an abnormality consistent with granulomatous mastitis. Did have another repeat ultrasound that have also personally reviewed showing small focal area at 10:00 that is nonspecific. 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 She denies nipple discharge, erythema, fevers, or chills. She also denies any recent known injury to the right breast.  She is doing much better and denies any active pain right now.  No fevers no chills   HPI  Past Medical History:  Diagnosis Date   Anemia    Anxiety    Asthma    Hypertension    Vitamin D deficiency 04/19/2014    Past Surgical History:  Procedure Laterality Date   BREAST BIOPSY Right 08/18/2023   US  RT BREAST BX W LOC DEV 1ST LESION IMG BX SPEC US  GUIDE 08/18/2023 [facility]   BREAST BIOPSY Right 02/24/2024   US  RT BREAST BX W LOC DEV 1ST LESION IMG BX SPEC US  GUIDE 02/24/2024 [facility]   CESAREAN SECTION  03/13/2020   Procedure: CESAREAN SECTION;  Surgeon: Arloa Lamar SQUIBB, MD;  Location: [facility];  Service: Obstetrics;;   HERNIA REPAIR     [facility]   TONSILLECTOMY AND ADENOIDECTOMY N/A 08/30/2014   Procedure: TONSILLECTOMY AND ADENOIDECTOMY;  Surgeon: Carolee Hunter, MD;  Location: [facility];  Service: ENT;  Laterality: N/A;  adenoids cauterized no tissue sent to lab   WISDOM TOOTH EXTRACTION      Family History  Problem Relation Age of Onset   Seizures Mother    Bipolar disorder Mother    ADD / ADHD Mother    Anxiety disorder Mother    Migraines Mother    Migraines Father    Diabetes Father    Hypertension Father    Bipolar disorder Sister    ADD / ADHD Sister     Migraines Sister    Depression Maternal Grandmother    Migraines Maternal Grandmother    Hypertension Maternal Grandmother    Asthma Maternal Grandmother    Anxiety disorder Maternal Grandmother    Hearing loss Maternal Grandmother    Suicidality Maternal Grandfather    Hypertension Paternal Grandmother    Bipolar disorder Paternal Grandmother    Migraines Paternal Grandmother     Social History Social History   Tobacco Use   Smoking status: Never    Passive exposure: Yes   Smokeless tobacco: Never  Vaping Use   Vaping status: Never Used  Substance Use Topics   Alcohol use: No   Drug use: No    Allergies  Allergen Reactions   Augmentin [Amoxicillin-Pot Clavulanate] Hives   Bee Venom Anaphylaxis    Uses and Epi Pen for this. Per mother   Shellfish Allergy Anaphylaxis, Hives and Other (See Comments)    Current Outpatient Medications  Medication Sig Dispense Refill   albuterol  (VENTOLIN  HFA) 108 (90 Base) MCG/ACT inhaler Inhale 2 puffs into the lungs every 6 (six) hours as needed for wheezing or shortness of breath. 8 g 2   EPINEPHrine  0.3 mg/0.3 mL IJ SOAJ injection Inject 0.3 mg into the muscle as needed. For Bee stings     No current facility-administered medications for this visit.  Review of Systems Full ROS  was asked and was negative except for the information on the HPI  Physical Exam Blood pressure (!) 159/105, pulse 94, temperature 98 F (36.7 C), temperature source Oral, height 5' 3 (1.6 m), weight 277 lb 6.4 oz (125.8 kg), last menstrual period 03/05/2024, SpO2 98%. CONSTITUTIONAL: NAD, chaperone present. EYES: Pupils are equal, round, Sclera are non-icteric. EARS, NOSE, MOUTH AND THROAT: The oropharynx is clear. The oral mucosa is pink and moist. Hearing is intact to voice. LYMPH NODES:  Lymph nodes in the neck are normal. RESPIRATORY:  Lungs are clear. There is normal respiratory effort, with equal breath sounds bilaterally, and without  pathologic use of accessory muscles. BREAST: No obvious palpable breast areas.  There is might be some fullness located in the right breast outer upper quadrant.  No nipple discharge and no skin or nipple lesions. CARDIOVASCULAR: Heart is regular without murmurs, gallops, or rubs. GI: The abdomen is  soft, nontender, and nondistended. There are no palpable masses. There is no hepatosplenomegaly. There are normal bowel sounds in all quadrants. GU: Rectal deferred.   MUSCULOSKELETAL: Normal muscle strength and tone. No cyanosis or edema.   SKIN: Turgor is good and there are no pathologic skin lesions or ulcers. NEUROLOGIC: Motor and sensation is grossly normal. Cranial nerves are grossly intact. PSYCH:  Oriented to person, place and time. Affect is normal.  Data Reviewed I have personally reviewed the patient's imaging, laboratory findings and medical records.    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  There is no evidence of an active abscess or active infection or anything that would warrant any surgical intervention.  Discussed with her in detail that I would not do any treatment with any antibiotics or steroids as there is no true indication for it.  I had an extensive discussion with both the patient and the family about her disease process.  They are very appreciative.  For now we will continue watchful waiting and supportive care I personally spent a total of 55 minutes in the care of the patient today including performing a medically appropriate exam/evaluation, counseling and educating, placing orders, referring and communicating with other health care professionals, documenting clinical information in the EHR, independently interpreting and reviewing images studies and coordinating care.  A copy of this report was sent to the referring provider  Laneta Luna, MD FACS General Surgeon 03/23/2024, 2:27  PM

## 2024-03-23 NOTE — ED Provider Notes (Signed)
 [facility] EMERGENCY DEPT  ED Provider Note History   Chief Complaint  Patient presents with  . Dizziness    History of Present Illness Sarah Oneill is a 23 year old female who presents with dizziness and balance issues.  Dizziness began yesterday and worsened today, described as a sensation of walking sideways and floating back and forth with a sense of imbalance, particularly tipping over to the right. The dizziness is exacerbated by head movements, especially when turning her head or spinning around the room.  She experienced difficulty processing conversations, needing to focus to understand what was being said, particularly when her children were speaking to her. After resting and eating yesterday, the symptoms temporarily improved but returned today.  No fever, chills, or ear pain. She reports a headache localized to the right side of her head. She also experienced tremors in both hands earlier, which have since resolved.  There is no family history of neurological conditions. She denies recent chiropractic work or car accidents.  Level of Interpreter Services: No interpreter needed (no language barrier)  Past Medical History:  Diagnosis Date  . ADHD (attention deficit hyperactivity disorder)   . Bee sting allergy   . Obesity   . Seasonal allergies   . Shellfish allergy    Past Surgical History:  Procedure Laterality Date  . INGUINAL HERNIA REPAIR     at age 75   Family History  Problem Relation Age of Onset  . Diabetes type II Other   . Obesity Sister   . Thyroid disease Paternal Grandmother   . Hyperlipidemia (Elevated cholesterol) Paternal Grandmother   . High blood pressure (Hypertension) Paternal Grandmother   . Obesity Mother   . High blood pressure (Hypertension) Mother   . Obesity Father   . Diabetes type II Father   . High blood pressure (Hypertension) Father   . Diabetes type II Maternal Uncle   . Thyroid disease Paternal Aunt   . Hyperlipidemia (Elevated  cholesterol) Maternal Grandmother   . High blood pressure (Hypertension) Maternal Grandmother   . Diabetes type II Maternal Grandfather   . Myocardial Infarction (Heart attack) Maternal Grandfather    Social History   Socioeconomic History  . Marital status: Single  Tobacco Use  . Smoking status: Never  . Smokeless tobacco: Never  Social History Narrative   Chasey is living with her biological father.   Mom was an innocent bystander who was shot in a random shooting in August 2014.   Donzell visit with mom and mom's boyfriend on weekends.   She has an older sister (age 59) who is staying with her MGM.   Lashai is in the 12th grade at [employer] and is on the A/B honor roll.   She wants to be a manufacturing engineer.  Looking at [employer] then transferring to [employer]   Review of Systems  All other systems reviewed and are negative.   Physical Exam  BP (!) 125/93 (BP Location: Right upper arm, Patient Position: Sitting)   Pulse 102   Temp 36.7 C (98.1 F) (Tympanic)   Resp 18   SpO2 100%  Physical Exam Vitals and nursing note reviewed.  Constitutional:      General: She is not in acute distress.    Appearance: She is well-developed. She is not diaphoretic.  HENT:     Head: Normocephalic and atraumatic.     Right Ear: External ear normal.     Left Ear: External ear normal.  Eyes:  General:        Right eye: No discharge.        Left eye: No discharge.     Conjunctiva/sclera: Conjunctivae normal.     Pupils: Pupils are equal, round, and reactive to light.  Cardiovascular:     Rate and Rhythm: Normal rate and regular rhythm.     Heart sounds: Normal heart sounds.  Pulmonary:     Effort: Pulmonary effort is normal. No respiratory distress.     Breath sounds: Normal breath sounds.  Abdominal:     General: Bowel sounds are normal. There is no distension.     Palpations: Abdomen is soft.  Musculoskeletal:        General: Normal range of motion.     Cervical  back: Normal range of motion and neck supple.  Skin:    General: Skin is warm and dry.     Capillary Refill: Capillary refill takes less than 2 seconds.  Neurological:     Mental Status: She is alert and oriented to person, place, and time.     Cranial Nerves: No cranial nerve deficit.     Sensory: No sensory deficit.     Motor: No weakness.     Coordination: Coordination normal.     Gait: Gait normal.  Psychiatric:        Behavior: Behavior normal.    Physical Exam NEUROLOGICAL: Sensation symmetric and intact. Normal finger to nose test.   Medical Decision Making   Medical Decision Making 23 year old female presented with acute onset dizziness and imbalance, worsening today, described as a sensation of tipping to the right and difficulty processing speech, with associated right-sided headache. Symptoms are exacerbated by head movement and include transient bilateral hand tremors. No fever, chills, ear pain, recent trauma, or family history of neurological disease. Neurological exam was performed and was notable for preserved coordination and sensation.  Differential diagnosis includes, but is not limited to: - Vestibular Dysfunction most likely  (BPPV): Acute dizziness and imbalance with symptoms worsened by head movement are suggestive of a vestibular etiology. Pain is intermittent, worse w looking R.  Cerebellar exam is completely intact here with intact gait, improvement of symptoms with meclizine and IVF.  Less likely Central Neurological Event (e.g., Stroke, Mass Lesion): The central causes were considered due to acute onset, imbalance, and right-sided headache, prompting neuroimaging to rule out serious pathology; time of last known well is 2 days ago. However, with the positional nature and intermittent nature of symptoms with improvement with meclizine and IVF, this is less very likely.    Plan:  Orders Placed This Encounter  Procedures  . CULTURE, URINE, REFLEXED FROM PYURIA  SCREEN  . CT head and neck angiogram with and without contrast protocol  . Basic Metabolic Panel (BMP)  . Complete Blood Count (CBC) with Differential  . Culture, Urine, Routine with Pyuria Screen (reflexed from Urinalysis)  . POC Urine HCG Pregnancy Test  . ECG 12-lead    Medications Administered in the Emergency Department   lactated ringers  bolus 1,000 mL (0 mLs Intravenous Stopped 03/23/24 1328) meclizine (ANTIVERT) tablet 25 mg (25 mg Oral Given 03/23/24 1217) iopamidoL (ISOVUE-370) 76% injection 50 mL (50 mLs Intravenous Given 03/23/24 1414)   ED Clinical Impression  1. Dizziness      Labs reviewed with patient.  No urinary symptoms. UA contaminated. Will defer treatment.   Results for orders placed or performed during the hospital encounter of 03/23/24  Basic Metabolic Panel (BMP)  Result Value  Ref Range   Sodium 137 135 - 145 mmol/L   Potassium     Chloride 104 98 - 108 mmol/L   Carbon Dioxide (CO2) 24 21 - 30 mmol/L   Urea Nitrogen (BUN) 9 7 - 20 mg/dL   Creatinine 0.9 0.4 - 1.0 mg/dL   Glucose 84 70 - 859 mg/dL   Calcium 9.2 8.7 - 89.7 mg/dL   Anion Gap 9 3 - 12 mmol/L   BUN/CREA Ratio 10 6 - 27   Glomerular Filtration Rate (eGFR)  92 mL/min/1.73sq m  Complete Blood Count (CBC) with Differential  Result Value Ref Range   WBC (White Blood Cell Count) 10.7 (H) 3.2 - 9.8 x10^9/L   Hemoglobin 14.2 11.7 - 15.5 g/dL   Hematocrit 56.8 64.9 - 45.0 %   Platelets 282 150 - 450 x10^9/L   MCV (Mean Corpuscular Volume) 85 80 - 98 fL   MCH (Mean Corpuscular Hemoglobin) 27.8 26.5 - 34.0 pg   MCHC (Mean Corpuscular Hemoglobin Concentration) 32.9 31.0 - 36.0 %   RBC (Red Blood Cell Count) 5.10 3.77 - 5.16 x10^12/L   RDW-CV (Red Cell Distribution Width) 13.4 11.5 - 14.5 %   NRBC (Nucleated Red Blood Cell Count) 0.00 0 x10^9/L   NRBC % (Nucleated Red Blood Cell %) 0.0 %   MPV (Mean Platelet Volume) 10.1 7.2 - 11.7 fL   Neutrophil Count 6.8 2.0 - 8.6 x10^9/L   Neutrophil % 63.4  37 - 80 %   Lymphocyte Count 3.1 0.6 - 4.2 x10^9/L   Lymphocyte % 29.4 10 - 50 %   Monocyte Count 0.6 0 - 0.9 x10^9/L   Monocyte % 5.8 0 - 12 %   Eosinophil Count 0.09 0 - 0.70 x10^9/L   Eosinophil % 0.8 0 - 7 %   Basophil Count 0.03 0 - 0.20 x10^9/L   Basophil % 0.3 0 - 2 %   Immature Granulocyte Count 0.03 <=0.06 x10^9/L   Immature Granulocyte % 0.3 <=0.7 %  Culture, Urine, Routine with Pyuria Screen (reflexed from Urinalysis)  Result Value Ref Range   See Comment     Color Yellow Colorless, Straw, Light Yellow, Yellow, Dark Yellow   Clarity Cloudy (!) Clear   Specific Gravity 1.021 1.005 - 1.030   pH, Urine 6.0 5.0 - 8.0   Protein, Urinalysis Trace (!) Negative   Glucose, Urinalysis Negative Negative   Ketones, Urinalysis Trace (!) Negative   Blood, Urinalysis Negative Negative   Nitrite, Urinalysis Negative Negative   Leukocytes, Urinalysis 1+ (!) Negative   Bilirubin, Urinalysis Negative Negative   Urobilinogen, Urinalysis 0.2 0.2 - 1.0 mg/dL   Red Blood Cells, Urinalysis 1 <=3 /hpf   WBC, UA 2 <=5 /hpf   Squamous Epithelial Cells, Urinalysis 51 /hpf   Hyaline Casts 0 /lpf   Narrative   Reporting of microscopic urinalysis (UA) bacteria and yeast results was discontinued on August 30th, 2021 due to poor clinical predictive value for urinary tract infection (UTI). Providers should use other clinical signs and symptoms to diagnose UTI including urine leukocyte esterase, white blood cell count, and culture in accordance with local and national guidelines. Additional information and rationale can obtained by emailing [email or web address].  POC Urine HCG Pregnancy Test  Result Value Ref Range   Preg Test, Ur Negative   ECG 12-lead  Result Value Ref Range   Vent Rate (bpm) 100    PR Interval (msec) 136    QRS Interval (msec) 76    QT Interval (msec) 346  QTc (msec) 446    Narrative   Normal sinus rhythm Normal ECG  No previous ECGs available I reviewed and concur  with this report. Electronically signed ab:[email or web address], AUGUSTUS ([id number]) on 03/23/2024 12:52:23 PM   3:03 PM  Pt reassessed with resolution of symptoms. Confirmed that symptoms were when she looked to the R or turned too quickly to the right. Neuro exam repeated and still intact. Awaiting CTA results and if neg, will d/c home with f/u.   This note has been created using automated tools and reviewed for accuracy by [facility] NATESAN.   AI Note Feedback-- Point of Care Survey   Atha Latisha Pa, MD 03/23/24 [phone]

## 2024-03-23 NOTE — ED Provider Notes (Signed)
 5:16 PM Was signed out with patient was signed out to me with the complaint of dizziness and pending CT results.  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  I interviewed and examined the patient.  She complains of 2 days of dizziness sensation which she describes and as imbalance.  She does not feel she is going to faint.  She feels like she has to hold onto things to keep from falling.  Her hearing is unaffected.  She does not have any ear pain.  She does not endorse any other strokelike symptoms and her CTA was normal.  I did a neurologic exam.  This appears normal with the exception of slight bilateral lateral gaze nystagmus which extinguishes.  Her finger-nose testing was normal.  Heel-to-shin was normal.  Rapid alternating movements were normal.  I walked the patient in the hallway.  She was able to walk without obvious ataxia but said that she felt a little imbalanced.  Will consult ENT for recommendations.  5:44 PM Spoke with ENT.  They recommended referral ASAP to the vestibular clinic with ENT.  They recommended meclizine for symptoms in the interim.  I informed the patient about this.  She also requested a work note.   Sigurd Fonda Radford, MD 03/23/24 [phone]

## 2024-04-10 NOTE — Progress Notes (Unsigned)
 " NEW patient visit  Patient: Sarah Oneill   DOB: 2001/01/28   23 y.o. Female  MRN: [id number] Visit Date: 04/12/2024  Today's healthcare provider: Jolynn Spencer, PA-C   Chief Complaint  Patient presents with   Establish Care    New patient establishing care. Previous pcp: [facility] Last pap smear: 07/31/2023 sees GYN-Alicia Copland Immunizations: UTD Patient sees Franky Daring at [facility] for BPPV) Mammogram/US : followed by GYN   Hypertension    Patient reports that she has been having elevated blood pressures reading.   Depression    And Anxiety-patient would like to got to counseling.   Subjective     HPI     Establish Care    Additional comments: New patient establishing care. Previous pcp: [facility] Last pap smear: 07/31/2023 sees GYN-Alicia Copland Immunizations: UTD Patient sees Franky Daring at [facility] for BPPV) Mammogram/US : followed by GYN        Hypertension    Additional comments: Patient reports that she has been having elevated blood pressures reading.        Depression    Additional comments: And Anxiety-patient would like to got to counseling.      Last edited by Rosas, Joseline E, CMA on 04/12/2024 10:50 AM.       Discussed the use of AI scribe software for clinical note transcription with the patient, who gave verbal consent to proceed.  History of Present Illness Sarah Oneill is a 23 year old female with hypertension and vertigo who presents with elevated blood pressure and dizziness. She is accompanied by her child.  She reports elevated blood pressure for the past two weeks, consistently noted at visits. Hypertension began during pregnancy. She feels her blood pressure rises with anxiety and nervousness. She is not taking antihypertensive medication.  She has vertigo for about three weeks, triggered by fast movements, especially turning her head to the left. CT head at [facility] showed no acute intracranial abnormality. 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 A follow-up CT at [facility] is pending.  She has chronic constipation and attributes current symptoms to diet. She notes significant weight gain since pregnancy and now weighs over 175 pounds.  She has recurrent urinary tract infections and difficulty with urination. She drinks little water.  She has anxiety and was diagnosed with attention deficit disorder in childhood. She takes no medication and reports increased anxiety after recent family stressors.  She does not use tobacco, alcohol, or recreational drugs. She denies current headaches, hearing problems, or visual changes.       04/12/2024   10:41 AM 07/17/2022    8:41 AM  Depression screen PHQ 2/9  Decreased Interest 3 2  Down, Depressed, Hopeless 1 0  PHQ - 2 Score 4 2  Altered sleeping 3 0  Tired, decreased energy 3 1  Change in appetite 3 1  Feeling bad or failure about yourself  1 0  Trouble concentrating 3 0  Moving slowly or fidgety/restless 1 0  Suicidal thoughts 0 0  PHQ-9 Score 18 4   Difficult doing work/chores Very difficult Somewhat difficult     Data saved with a previous flowsheet row definition      04/12/2024   10:43 AM  GAD 7 : Generalized Anxiety Score  Nervous, Anxious, on Edge 3  Control/stop worrying 3  Worry too much - different things 3  Trouble relaxing 3  Restless 0  Easily annoyed or irritable 3  Afraid -  awful might happen 3  Total GAD 7 Score 18  Anxiety Difficulty Extremely difficult    Medications: Show/hide medication list[1]  Review of Systems  All other systems reviewed and are negative.  All negative Except see HPI       Objective    BP (!) 159/108 (BP Location: Right Arm, Patient Position: Sitting, Cuff Size: Large)   Pulse 96   Resp 16   Ht 5' 3 (1.6 m)   Wt 277 lb 4.8 oz (125.8 kg)   LMP 04/02/2024 (Exact Date)   SpO2 98%   BMI 49.12 kg/m     Physical Exam Vitals reviewed.  Constitutional:      General: She is  not in acute distress.    Appearance: Normal appearance. She is well-developed. She is not diaphoretic.  HENT:     Head: Normocephalic and atraumatic.  Eyes:     General: No scleral icterus.    Conjunctiva/sclera: Conjunctivae normal.  Neck:     Thyroid: No thyromegaly.  Cardiovascular:     Rate and Rhythm: Normal rate and regular rhythm.     Pulses: Normal pulses.     Heart sounds: Normal heart sounds. No murmur heard. Pulmonary:     Effort: Pulmonary effort is normal. No respiratory distress.     Breath sounds: Normal breath sounds. No wheezing, rhonchi or rales.  Musculoskeletal:     Cervical back: Neck supple.     Right lower leg: No edema.     Left lower leg: No edema.  Lymphadenopathy:     Cervical: No cervical adenopathy.  Skin:    General: Skin is warm and dry.     Findings: No rash.  Neurological:     Mental Status: She is alert and oriented to person, place, and time. Mental status is at baseline.  Psychiatric:        Mood and Affect: Mood normal.        Behavior: Behavior normal.      No results found for any visits on 04/12/24.      Assessment & Plan Primary hypertension Chronic and unstable Hypertension elevated for two weeks, possibly due to anxiety and vertigo. Blood pressure extremely high. - Prescribed amlodipine  5. - Instructed to measure blood pressure twice daily. - Advised to hydrate and avoid salty foods. - Scheduled follow-up in four weeks.  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 Awaiting further CT and ENT evaluation. Saw ENT on 04/08/24 Continue meclizine as needed for vertigo.  Morbid obesity Chronic and stable Body mass index is 49.12 kg/m. Associated with hypertension  significant weight gain post-pregnancy, over 175 pounds, contributing to health issues. - Advised dietary modifications: high fiber, prunes, apricots, probiotics, kefir,  yogurt. Will follow-up  Chronic constipation Infrequent bowel movements, history of impaction. Managed with dietary adjustments. - Advised dietary modifications: high fiber, prunes, apricots, probiotics, kefir, yogurt. Will follow-up  Generalized anxiety disorder and attention-deficit hyperactivity disorder, combined type Severe anxiety with mood swings. ADHD diagnosed at age 52, no current medication. - Referred to therapist for counseling. - Referred to psychiatrist for medication management. - Started citalopram , instructed to titrate as tolerated. Collaboration of Care: Medication Management AEB  , Primary Care Provider AEB  , Psychiatrist AEB  , and Referral or follow-up with counselor/therapist AEB    Patient/Guardian was advised Release of Information must be obtained prior to any record release in order to collaborate their care with an outside provider.  Patient/Guardian was advised if they have not already done so to contact the registration department to sign all necessary forms in order for us  to release information regarding their care.   Consent: Patient/Guardian gives verbal consent for treatment and assignment of benefits for services provided during this visit. Patient/Guardian expressed understanding and agreed to proceed.  Primary hypertension (Primary)  - amLODipine  (NORVASC ) 5 MG tablet; Take 1 tablet (5 mg total) by mouth daily.  Dispense: 90 tablet; Refill: 0  Morbid obesity (HCC)  - CBC with Differential/Platelet - Comprehensive metabolic panel with GFR - Hemoglobin A1c - Lipid panel - TSH - T4, free - Vitamin B12 - VITAMIN D  25 Hydroxy (Vit-D Deficiency, Fractures)  Attention deficit hyperactivity disorder (ADHD), combined type  - Amb ref to Integrated Behavioral Health - Ambulatory referral to Psychiatry  Anxiety state Depression, major, single episode, moderate (HCC) - citalopram  (CELEXA ) 10 MG tablet; Take 1 tablet (10 mg total) by mouth daily.   Dispense: 30 tablet; Refill: 3 - Amb ref to Integrated Behavioral Health - Ambulatory referral to Psychiatry  Establish care Welcomed to our clinic Reviewed past medical hx, social hx, family hx and surgical hx Pt advised to sign a release form for her old records Including her vaccination records   No orders of the defined types were placed in this encounter.   No follow-ups on file.   The patient was advised to call back or seek an in-person evaluation if the symptoms worsen or if the condition fails to improve as anticipated.  I discussed the assessment and treatment plan with the patient. The patient was provided an opportunity to ask questions and all were answered. The patient agreed with the plan and demonstrated an understanding of the instructions.  I, Tomie Spizzirri, PA-C have reviewed all documentation for this visit. The documentation on 04/12/2024  for the exam, diagnosis, procedures, and orders are all accurate and complete.  Jolynn Spencer, [facility], MMS [facility] [phone] (phone) [phone] (fax)   Medical [facility]     [1]  Outpatient Medications Prior to Visit  Medication Sig   meclizine (ANTIVERT) 25 MG tablet Take 25 mg by mouth.   albuterol  (VENTOLIN  HFA) 108 (90 Base) MCG/ACT inhaler Inhale 2 puffs into the lungs every 6 (six) hours as needed for wheezing or shortness of breath.   EPINEPHrine  0.3 mg/0.3 mL IJ SOAJ injection Inject 0.3 mg into the muscle as needed. For Bee stings   No facility-administered medications prior to visit.   "

## 2024-04-12 ENCOUNTER — Ambulatory Visit: Admitting: Physician Assistant

## 2024-04-12 ENCOUNTER — Encounter: Payer: Self-pay | Admitting: Physician Assistant

## 2024-04-12 VITALS — BP 159/108 | HR 96 | Resp 16 | Ht 63.0 in | Wt 277.3 lb

## 2024-04-12 DIAGNOSIS — Z7689 Persons encountering health services in other specified circumstances: Secondary | ICD-10-CM

## 2024-04-12 DIAGNOSIS — I10 Essential (primary) hypertension: Secondary | ICD-10-CM

## 2024-04-12 DIAGNOSIS — F321 Major depressive disorder, single episode, moderate: Secondary | ICD-10-CM

## 2024-04-12 DIAGNOSIS — F902 Attention-deficit hyperactivity disorder, combined type: Secondary | ICD-10-CM

## 2024-04-12 DIAGNOSIS — F411 Generalized anxiety disorder: Secondary | ICD-10-CM

## 2024-04-12 DIAGNOSIS — H838X9 Other specified diseases of inner ear, unspecified ear: Secondary | ICD-10-CM | POA: Diagnosis not present

## 2024-04-12 MED ORDER — CITALOPRAM HYDROBROMIDE 10 MG PO TABS: 10.0000 mg | ORAL_TABLET | Freq: Every day | ORAL | 3 refills | Status: AC

## 2024-04-12 MED ORDER — AMLODIPINE BESYLATE 5 MG PO TABS: 5.0000 mg | ORAL_TABLET | Freq: Every day | ORAL | 0 refills | Status: AC

## 2024-04-13 LAB — COMPREHENSIVE METABOLIC PANEL WITH GFR
ALT: 18 IU/L (ref 0–32)
AST: 16 IU/L (ref 0–40)
Albumin: 4.4 g/dL (ref 4.0–5.0)
Alkaline Phosphatase: 71 IU/L (ref 41–116)
BUN/Creatinine Ratio: 11 (ref 9–23)
BUN: 11 mg/dL (ref 6–20)
Bilirubin Total: 0.4 mg/dL (ref 0.0–1.2)
CO2: 23 mmol/L (ref 20–29)
Calcium: 9.8 mg/dL (ref 8.7–10.2)
Chloride: 102 mmol/L (ref 96–106)
Creatinine, Ser: 1 mg/dL (ref 0.57–1.00)
Globulin, Total: 3.4 g/dL (ref 1.5–4.5)
Glucose: 84 mg/dL (ref 70–99)
Potassium: 4.8 mmol/L (ref 3.5–5.2)
Sodium: 140 mmol/L (ref 134–144)
Total Protein: 7.8 g/dL (ref 6.0–8.5)
eGFR: 81 mL/min/1.73

## 2024-04-13 LAB — CBC WITH DIFFERENTIAL/PLATELET
Basophils Absolute: 0 x10E3/uL (ref 0.0–0.2)
Basos: 0 %
EOS (ABSOLUTE): 0.2 x10E3/uL (ref 0.0–0.4)
Eos: 3 %
Hematocrit: 44.4 % (ref 34.0–46.6)
Hemoglobin: 14.1 g/dL (ref 11.1–15.9)
Immature Grans (Abs): 0 x10E3/uL (ref 0.0–0.1)
Immature Granulocytes: 0 %
Lymphocytes Absolute: 2.4 x10E3/uL (ref 0.7–3.1)
Lymphs: 34 %
MCH: 28 pg (ref 26.6–33.0)
MCHC: 31.8 g/dL (ref 31.5–35.7)
MCV: 88 fL (ref 79–97)
Monocytes Absolute: 0.5 x10E3/uL (ref 0.1–0.9)
Monocytes: 7 %
Neutrophils Absolute: 4 x10E3/uL (ref 1.4–7.0)
Neutrophils: 56 %
Platelets: 314 x10E3/uL (ref 150–450)
RBC: 5.04 x10E6/uL (ref 3.77–5.28)
RDW: 13.4 % (ref 11.7–15.4)
WBC: 7.1 x10E3/uL (ref 3.4–10.8)

## 2024-04-13 LAB — LIPID PANEL
Chol/HDL Ratio: 4.9 ratio — ABNORMAL HIGH (ref 0.0–4.4)
Cholesterol, Total: 201 mg/dL — ABNORMAL HIGH (ref 100–199)
HDL: 41 mg/dL
LDL Chol Calc (NIH): 145 mg/dL — ABNORMAL HIGH (ref 0–99)
Triglycerides: 82 mg/dL (ref 0–149)
VLDL Cholesterol Cal: 15 mg/dL (ref 5–40)

## 2024-04-13 LAB — VITAMIN B12: Vitamin B-12: 659 pg/mL (ref 232–1245)

## 2024-04-13 LAB — HEMOGLOBIN A1C
Est. average glucose Bld gHb Est-mCnc: 120 mg/dL
Hgb A1c MFr Bld: 5.8 % — ABNORMAL HIGH (ref 4.8–5.6)

## 2024-04-13 LAB — T4, FREE: Free T4: 1.07 ng/dL (ref 0.82–1.77)

## 2024-04-13 LAB — VITAMIN D 25 HYDROXY (VIT D DEFICIENCY, FRACTURES): Vit D, 25-Hydroxy: 12.1 ng/mL — ABNORMAL LOW (ref 30.0–100.0)

## 2024-04-13 LAB — TSH: TSH: 2.37 u[IU]/mL (ref 0.450–4.500)

## 2024-04-14 ENCOUNTER — Ambulatory Visit: Payer: Self-pay | Admitting: Physician Assistant

## 2024-04-14 DIAGNOSIS — E559 Vitamin D deficiency, unspecified: Secondary | ICD-10-CM

## 2024-04-14 MED ORDER — VITAMIN D (ERGOCALCIFEROL) 1.25 MG (50000 UNIT) PO CAPS: 50000.0000 [IU] | ORAL_CAPSULE | ORAL | 0 refills | Status: AC

## 2024-05-27 ENCOUNTER — Encounter: Payer: Self-pay | Admitting: Family Medicine

## 2024-05-27 ENCOUNTER — Ambulatory Visit: Admitting: Family Medicine

## 2024-05-27 VITALS — BP 154/93 | HR 115 | Temp 98.7°F | Ht 63.0 in | Wt 275.3 lb

## 2024-05-27 DIAGNOSIS — Z124 Encounter for screening for malignant neoplasm of cervix: Secondary | ICD-10-CM

## 2024-05-27 DIAGNOSIS — Z1159 Encounter for screening for other viral diseases: Secondary | ICD-10-CM

## 2024-05-27 DIAGNOSIS — F321 Major depressive disorder, single episode, moderate: Secondary | ICD-10-CM

## 2024-05-27 DIAGNOSIS — I10 Essential (primary) hypertension: Secondary | ICD-10-CM

## 2024-05-27 DIAGNOSIS — A609 Anogenital herpesviral infection, unspecified: Secondary | ICD-10-CM

## 2024-05-27 DIAGNOSIS — F411 Generalized anxiety disorder: Secondary | ICD-10-CM

## 2024-05-27 DIAGNOSIS — F902 Attention-deficit hyperactivity disorder, combined type: Secondary | ICD-10-CM

## 2024-05-27 DIAGNOSIS — Z91013 Allergy to seafood: Secondary | ICD-10-CM

## 2024-05-27 DIAGNOSIS — G44219 Episodic tension-type headache, not intractable: Secondary | ICD-10-CM

## 2024-05-27 DIAGNOSIS — E88819 Insulin resistance, unspecified: Secondary | ICD-10-CM

## 2024-05-27 MED ORDER — TOPIRAMATE 25 MG PO TABS: ORAL_TABLET | ORAL | 2 refills | Status: AC

## 2024-05-27 NOTE — Patient Instructions (Addendum)
 Start taking the citalopram  to address your anxiety.   You are due for your pap smear in April 2026.  ______________________________________________  Check your blood pressure once daily, and any time you have concerning symptoms like headache, chest pain, dizziness, shortness of breath, or vision changes.   Our goal is less than 130/80.  To appropriately check your blood pressure, make sure you do the following:  1) Avoid caffeine, exercise, or tobacco products for 30 minutes before checking. Empty your bladder. 2) Sit with your back supported in a flat-backed chair. Rest your arm on something flat (arm of the chair, table, etc). 3) Sit still with your feet flat on the floor, resting, for at least 5 minutes.  4) Check your blood pressure. Take 1-2 readings.  5) Write down these readings and bring with you to any provider appointments.  Bring your home blood pressure machine with you to a provider's office for accuracy comparison at least once a year.   Make sure you take your blood pressure medications before you come to any office visit, even if you were asked to fast for labs.   ______________________________________________   Increase your intake of fresh/frozen fruits and vegetables.  The Mediterranean diet is a great reference for meal ideas.  I strongly encourage you to incorporate exercise into your daily routine (at least 30 minutes most days of the week) and monitor your dietary intake. - I encourage you to measure/weigh your foods/beverages for a few days to get a more accurate idea of what different amounts of things look like on your plate or in your glass.  - Using smaller plates/glasses also helps in that it tricks our minds into thinking we've eaten more than we have. Studies have shown that we eat more when presented with larger plates, even with the same amount of food on them.   - Plan to eat until you are no longer hungry, rather than until you're full.  If you  don't normally exercise, start with something simple or more enjoyable. You can plan to walk for your half hour or do something like dancing if you enjoy it.  Build up your activity over time; this will make it more enjoyable and reduce your risk of injury.  Here are some videos which offer a variety of different workout classes with different levels: https://couchtofitness.com/ It is completely free. If a full video is too much for you to do, you can do them in bite-sized chunks (just pausing when needed and resuming later in the day).   Even if these actions don't ultimately lead to weight loss, increasing your activity will still help to reduce your insulin resistance and will improve your cardiovascular health (making heart attack/stroke less likely as you age).

## 2024-05-27 NOTE — Progress Notes (Signed)
 "     Established patient visit   Patient: Sarah Oneill   DOB: 04/24/2000   24 y.o. Female  MRN: [id number] Visit Date: 05/27/2024  Today's healthcare provider: LAURAINE LOISE BUOY, DO   Chief Complaint  Patient presents with   Medical Management of Chronic Issues    Patient is here for a 4 week follow up regarding chronic management.  Have been having more headaches lately and has been more fatigued lately.  Went to [facility] today, states nothing was wrong with her inner ear.   Subjective    HPI Sarah Oneill is a 24 year old female with hypertension and headaches who presents for follow-up of blood pressure management and migraine symptoms.  She has been monitoring her blood pressure regularly and takes her medication in the morning before work. Her blood pressure tends to rise with activity, reaching up to 156/102 mmHg during an episode where she felt shaky and called an ambulance. A CT scan was performed, and she was told she was fine. Her blood pressure decreases when she is at rest.  She experiences increased frequency of headaches, described as a band-like sensation across the front of her head. Relief is found by lying down in a dark room and taking ibuprofen , which she has used every other day this week. She suspects light sensitivity as a trigger that worsens her headaches.  She has a history of anxiety and depression, and reports that her anxiety is severe. She has been prescribed anxiety medication but is hesitant to take it due to concerns about interactions with her blood pressure medication. She feels tired and drained, often needing to nap during the day.  She has gained significant weight over the past three years, attributing it to lifestyle and dietary changes post-pregnancy. She weighs over 170 pounds more than her pre-pregnancy weight. She is actively trying to lose weight by eating smaller portions and has lost two pounds recently. She believes weight loss may help  manage her blood pressure and improve her overall health.  She denies alcohol consumption and is considering options for weight management. She is interested in increasing her physical activity but is cautious due to asthma, which can be triggered by exercise.      Medications: Show/hide medication list[1]       Objective    BP (!) 154/93 (BP Location: Left Arm, Patient Position: Sitting, Cuff Size: Large)   Pulse (!) 115   Temp 98.7 F (37.1 C) (Oral)   Ht 5' 3 (1.6 m)   Wt 275 lb 4.8 oz (124.9 kg)   SpO2 100%   BMI 48.77 kg/m     Physical Exam Vitals and nursing note reviewed.  Constitutional:      General: She is not in acute distress.    Appearance: Normal appearance.  HENT:     Head: Normocephalic and atraumatic.  Eyes:     General: No scleral icterus.    Conjunctiva/sclera: Conjunctivae normal.  Cardiovascular:     Rate and Rhythm: Normal rate.  Pulmonary:     Effort: Pulmonary effort is normal.  Neurological:     Mental Status: She is alert and oriented to person, place, and time. Mental status is at baseline.  Psychiatric:        Mood and Affect: Mood normal.        Behavior: Behavior normal.      No results found for any visits on 05/27/24.  Assessment & Plan    Primary  hypertension  Morbid obesity (HCC) -     Topiramate ; Week 1: Take 1 tablet daily.  Week 2: Take 2 tablets daily.  Week 3: Take 3 tablets daily.  Week 4: Take 4 tablets daily.  If unable to tolerate higher dose, stay at maximum tolerated dose.  Dispense: 120 tablet; Refill: 2  Anxiety state  Episodic tension-type headache, not intractable     Primary hypertension Chronic, elevated today.  Blood pressure approximately 140/90 mmHg at home, higher with activity. Anxiety may contribute. On amlodipine . - Continue amlodipine  5 mg daily. - Educated on proper blood pressure measurement techniques. - Bring home blood pressure cuff and log to next appointment for calibration. -  Start citalopram  to begin to address anxiety, which may facilitate improvement of blood pressure.  Morbid obesity Chronic, associated with hypertension.  BMI today 48.77.  Significant weight gain over three years. Motivated for weight loss. Started dietary changes.  Discussed medication weight loss options.  Topiramate  chosen for appetite suppression due to migraine history. - Started topiramate  for appetite suppression and weight management. - Provided information on healthy eating, including portion control and incorporating fruits and vegetables. - Encouraged use of free fitness resources like [employer]. - Discussed potential use of albuterol  preemptively to prevent asthma exacerbation during exercise.  Anxiety state Chronic, anxiety may elevate blood pressure. Hesitant about anxiety medication. Citalopram  compatible with blood pressure medication. - Strongly encouraged patient to start citalopram  for anxiety management. - Continue counseling services. - Provided reassurance that citalopram  can be taken with blood pressure medication.  Episodic tension-type headache, not intractable Increased frequency, triggered by light sensitivity. Topiramate  considered for prevention. - Started topiramate  for dual purpose of headache prevention and appetite suppression.  General health maintenance Due for Pap smear in April 2026 (2 months from now). Discussed COVID booster and Pap smear scheduling. - Schedule Pap smear as per routine screening schedule. - Consider COVID booster.    Return in about 5 weeks (around 07/01/2024) for HTN, anxiety, weight w/PCP.      I discussed the assessment and treatment plan with the patient  The patient was provided an opportunity to ask questions and all were answered. The patient agreed with the plan and demonstrated an understanding of the instructions.   The patient was advised to call back or seek an in-person evaluation if the symptoms worsen or if  the condition fails to improve as anticipated.    LAURAINE LOISE BUOY, DO  [facility] [phone] (phone) [phone] (fax)  [facility]     [1]  Outpatient Medications Prior to Visit  Medication Sig   albuterol  (VENTOLIN  HFA) 108 (90 Base) MCG/ACT inhaler Inhale 2 puffs into the lungs every 6 (six) hours as needed for wheezing or shortness of breath.   amLODipine  (NORVASC ) 5 MG tablet Take 1 tablet (5 mg total) by mouth daily.   citalopram  (CELEXA ) 10 MG tablet Take 1 tablet (10 mg total) by mouth daily.   EPINEPHrine  0.3 mg/0.3 mL IJ SOAJ injection Inject 0.3 mg into the muscle as needed. For Bee stings   meclizine (ANTIVERT) 25 MG tablet Take 25 mg by mouth.   Vitamin D , Ergocalciferol , (DRISDOL ) 1.25 MG (50000 UNIT) CAPS capsule Take 1 capsule (50,000 Units total) by mouth every 7 (seven) days. may repeat 8- to 12-week course if 25-hydroxyvitamin D is less than 30 nanograms/mL   No facility-administered medications prior to visit.   "

## 2024-07-11 ENCOUNTER — Ambulatory Visit: Admitting: Physician Assistant
